# Patient Record
Sex: Female | Born: 1970 | Race: Black or African American | Hispanic: No | Marital: Single | Smoking: Current every day smoker
Health system: Southern US, Community
[De-identification: ages and names within clinical notes are randomized; demographics above are authoritative.]

## PROBLEM LIST (undated history)

## (undated) DIAGNOSIS — I10 Essential (primary) hypertension: Secondary | ICD-10-CM

## (undated) HISTORY — PX: LAPAROSCOPIC GASTRIC BAND REMOVAL WITH LAPAROSCOPIC GASTRIC SLEEVE RESECTION: SHX6498

## (undated) HISTORY — PX: TUBAL LIGATION: SHX77

## (undated) HISTORY — PX: LAPAROSCOPIC GASTRIC SLEEVE RESECTION: SHX5895

---

## 2009-10-26 ENCOUNTER — Emergency Department (HOSPITAL_COMMUNITY): Admission: EM | Admit: 2009-10-26 | Discharge: 2009-10-26 | Payer: Self-pay | Admitting: Emergency Medicine

## 2010-06-27 LAB — BASIC METABOLIC PANEL
BUN: 9 mg/dL (ref 6–23)
CO2: 24 mEq/L (ref 19–32)
Calcium: 8.9 mg/dL (ref 8.4–10.5)
Creatinine, Ser: 0.97 mg/dL (ref 0.4–1.2)
GFR calc Af Amer: >60 mL/min (ref >60)
Glucose, Bld: 99 mg/dL (ref 70–99)

## 2010-06-27 LAB — DIFFERENTIAL
Basophils Relative: 1 % (ref 0–1)
Eosinophils Absolute: 0.1 10*3/uL (ref 0.0–0.7)
Eosinophils Relative: 1 % (ref 0–5)
Neutrophils Relative %: 61 % (ref 43–77)

## 2010-06-27 LAB — CBC
MCH: 30.7 pg (ref 26.0–34.0)
MCHC: 33.8 g/dL (ref 30.0–36.0)
Platelets: 217 10*3/uL (ref 150–400)
RBC: 4.46 MIL/uL (ref 3.87–5.11)
RDW: 14 % (ref 11.5–15.5)

## 2010-08-13 ENCOUNTER — Emergency Department (HOSPITAL_COMMUNITY)
Admission: EM | Admit: 2010-08-13 | Discharge: 2010-08-14 | Disposition: A | Discharge status: Home / Self Care | Payer: Medicaid Other | Attending: Emergency Medicine | Admitting: Emergency Medicine

## 2010-08-13 DIAGNOSIS — R51 Headache: Secondary | ICD-10-CM | POA: Insufficient documentation

## 2010-08-13 DIAGNOSIS — K089 Disorder of teeth and supporting structures, unspecified: Secondary | ICD-10-CM | POA: Insufficient documentation

## 2010-08-13 DIAGNOSIS — K029 Dental caries, unspecified: Secondary | ICD-10-CM | POA: Insufficient documentation

## 2012-04-15 ENCOUNTER — Emergency Department (HOSPITAL_COMMUNITY)
Admission: EM | Admit: 2012-04-15 | Discharge: 2012-04-15 | Disposition: A | Discharge status: Home / Self Care | Payer: No Typology Code available for payment source | Attending: Emergency Medicine | Admitting: Emergency Medicine

## 2012-04-15 ENCOUNTER — Emergency Department (HOSPITAL_COMMUNITY): Payer: No Typology Code available for payment source

## 2012-04-15 ENCOUNTER — Encounter (HOSPITAL_COMMUNITY): Payer: Self-pay | Admitting: Emergency Medicine

## 2012-04-15 DIAGNOSIS — Y9389 Activity, other specified: Secondary | ICD-10-CM | POA: Insufficient documentation

## 2012-04-15 DIAGNOSIS — S161XXA Strain of muscle, fascia and tendon at neck level, initial encounter: Secondary | ICD-10-CM

## 2012-04-15 DIAGNOSIS — S335XXA Sprain of ligaments of lumbar spine, initial encounter: Secondary | ICD-10-CM | POA: Insufficient documentation

## 2012-04-15 DIAGNOSIS — Y92410 Unspecified street and highway as the place of occurrence of the external cause: Secondary | ICD-10-CM | POA: Insufficient documentation

## 2012-04-15 DIAGNOSIS — S139XXA Sprain of joints and ligaments of unspecified parts of neck, initial encounter: Secondary | ICD-10-CM | POA: Insufficient documentation

## 2012-04-15 DIAGNOSIS — S63509A Unspecified sprain of unspecified wrist, initial encounter: Secondary | ICD-10-CM | POA: Insufficient documentation

## 2012-04-15 DIAGNOSIS — S39012A Strain of muscle, fascia and tendon of lower back, initial encounter: Secondary | ICD-10-CM

## 2012-04-15 MED ORDER — CYCLOBENZAPRINE HCL 10 MG PO TABS: 10.0000 mg | ORAL_TABLET | Freq: Three times a day (TID) | ORAL | Status: DC | PRN

## 2012-04-15 MED ORDER — HYDROCODONE-ACETAMINOPHEN 5-325 MG PO TABS: 1.0000 | ORAL_TABLET | ORAL | Status: DC | PRN
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
## 2012-04-15 NOTE — ED Notes (Signed)
Pt returned from CT °

## 2012-04-15 NOTE — ED Notes (Signed)
Pt transported to CT ?

## 2012-08-26 ENCOUNTER — Encounter (HOSPITAL_COMMUNITY): Payer: Self-pay | Admitting: Physical Medicine and Rehabilitation

## 2012-08-26 ENCOUNTER — Emergency Department (HOSPITAL_COMMUNITY)
Admission: EM | Admit: 2012-08-26 | Discharge: 2012-08-26 | Disposition: A | Discharge status: Home / Self Care | Payer: Self-pay | Attending: Emergency Medicine | Admitting: Emergency Medicine

## 2012-08-26 DIAGNOSIS — W57XXXA Bitten or stung by nonvenomous insect and other nonvenomous arthropods, initial encounter: Secondary | ICD-10-CM

## 2012-08-26 DIAGNOSIS — Z880 Allergy status to penicillin: Secondary | ICD-10-CM | POA: Insufficient documentation

## 2012-08-26 DIAGNOSIS — T192XXA Foreign body in vulva and vagina, initial encounter: Secondary | ICD-10-CM | POA: Insufficient documentation

## 2012-08-26 DIAGNOSIS — R21 Rash and other nonspecific skin eruption: Secondary | ICD-10-CM | POA: Insufficient documentation

## 2012-08-26 DIAGNOSIS — Y939 Activity, unspecified: Secondary | ICD-10-CM | POA: Insufficient documentation

## 2012-08-26 DIAGNOSIS — Y929 Unspecified place or not applicable: Secondary | ICD-10-CM | POA: Insufficient documentation

## 2012-08-26 DIAGNOSIS — IMO0002 Reserved for concepts with insufficient information to code with codable children: Secondary | ICD-10-CM | POA: Insufficient documentation

## 2012-08-26 NOTE — ED Provider Notes (Signed)
History     CSN: [id number]  Arrival date & time 08/26/12  1019   First MD Initiated Contact with Patient 08/26/12 1025      No chief complaint on file.   (Consider location/radiation/quality/duration/timing/severity/associated sxs/prior treatment) HPI Comments: Patient presents to the ER for evaluation of possible retained tampon in vagina. Patient says that she thinks she left a tampon in 2 weeks ago. She has been trying to get it out but did not. She has been tried to pull it out as well as taking hot baths without success. She says there is a foul order associated with it. She has not had any further bleeding. There is no fever.  Patient also complains about a skin rash. Patient reports that her some brought straight to the room and she thinks that had fleas. She has multiple itchy bites on her  arms. She has been cleaning the areas with peroxide and putting hydrocortisone cream on, but it's not helping.   No past medical history on file.  No past surgical history on file.  No family history on file.  History  Substance Use Topics  . Smoking status: Never Smoker   . Smokeless tobacco: Never Used  . Alcohol Use:     OB History   Grav Para Term Preterm Abortions TAB SAB Ect Mult Living                  Review of Systems  Constitutional: Negative for fever.  Respiratory: Negative for shortness of breath.   Cardiovascular: Negative for chest pain.  Skin: Positive for rash.  All other systems reviewed and are negative.    Allergies  Penicillins  Home Medications   Current Outpatient Rx  Name  Route  Sig  Dispense  Refill  . cyclobenzaprine (FLEXERIL) 10 MG tablet   Oral   Take 1 tablet (10 mg total) by mouth 3 (three) times daily as needed for muscle spasms.   15 tablet   0   . HYDROcodone-acetaminophen (NORCO/VICODIN) 5-325 MG per tablet   Oral   Take 1 tablet by mouth every 4 (four) hours as needed for pain.   20 tablet   0   . ibuprofen  (ADVIL,MOTRIN) 200 MG tablet   Oral   Take 400 mg by mouth every 6 (six) hours as needed. For pain           There were no vitals taken for this visit.  Physical Exam  Constitutional: She is oriented to person, place, and time. She appears well-developed and well-nourished. No distress.  HENT:  Head: Normocephalic and atraumatic.  Right Ear: Hearing normal.  Left Ear: Hearing normal.  Nose: Nose normal.  Mouth/Throat: Oropharynx is clear and moist and mucous membranes are normal.  Eyes: Conjunctivae and EOM are normal. Pupils are equal, round, and reactive to light.  Neck: Normal range of motion. Neck supple.  Cardiovascular: Regular rhythm, S1 normal and S2 normal.  Exam reveals no gallop and no friction rub.   No murmur heard. Pulmonary/Chest: Effort normal and breath sounds normal. No respiratory distress. She exhibits no tenderness.  Abdominal: Soft. Normal appearance and bowel sounds are normal. There is no hepatosplenomegaly. There is no tenderness. There is no rebound, no guarding, no tenderness at McBurney's point and negative Murphy's sign. No hernia.  Musculoskeletal: Normal range of motion.  Neurological: She is alert and oriented to person, place, and time. She has normal strength. No cranial nerve deficit or sensory deficit. Coordination normal. GCS  eye subscore is 4. GCS verbal subscore is 5. GCS motor subscore is 6.  Skin: Skin is warm, dry and intact. No rash noted. No cyanosis.  Multiple excoriations on upper extremities without erythema, induration, fluctuance  Psychiatric: She has a normal mood and affect. Her speech is normal and behavior is normal. Thought content normal.    ED Course  Procedures (including critical care time)  Foreign body removal from vagina: Speculum was inserted into the vagina and tampon was visualized. It was grasped with ring forceps and removed in its entirety.  Labs Reviewed - No data to display No results found.   Diagnosis: 1.  Retained foreign body, vagina 2. Insect bites    MDM  Tampon was removed from the vagina. No further treatment necessary at this time. Patient does have multiple excoriated areas on her arms are consistent with the stated fleabites. No signs of infection. I recommended Benadryl and topical treatment.        Gilda Crease, MD 08/26/12 [phone]

## 2012-08-26 NOTE — ED Notes (Signed)
Pt discharged home. Had no further questions at the time. 

## 2012-08-26 NOTE — ED Notes (Signed)
Pt states tampon stuck in vagina x2 weeks. Unable to remove at home. Pt states 5/10 vaginal pressure at the time. No bleeding noted. Pt also states flea bites all over body from stray dog.

## 2012-08-26 NOTE — ED Notes (Signed)
Pt presents to department for evaluation of tampon in vagina x2 weeks. Pt states she is unable to remove. Pt states pain to vagina and itching. Also states flea bites from stray dog all over body. She is alert and oriented x4. No signs of acute distress noted.

## 2013-02-13 ENCOUNTER — Encounter (HOSPITAL_COMMUNITY): Payer: Self-pay | Admitting: Emergency Medicine

## 2013-02-13 ENCOUNTER — Emergency Department (HOSPITAL_COMMUNITY)
Admission: EM | Admit: 2013-02-13 | Discharge: 2013-02-13 | Disposition: A | Discharge status: Home / Self Care | Payer: No Typology Code available for payment source | Attending: Emergency Medicine | Admitting: Emergency Medicine

## 2013-02-13 DIAGNOSIS — IMO0002 Reserved for concepts with insufficient information to code with codable children: Secondary | ICD-10-CM | POA: Insufficient documentation

## 2013-02-13 DIAGNOSIS — Z880 Allergy status to penicillin: Secondary | ICD-10-CM | POA: Insufficient documentation

## 2013-02-13 DIAGNOSIS — Y92410 Unspecified street and highway as the place of occurrence of the external cause: Secondary | ICD-10-CM | POA: Insufficient documentation

## 2013-02-13 DIAGNOSIS — S39012A Strain of muscle, fascia and tendon of lower back, initial encounter: Secondary | ICD-10-CM

## 2013-02-13 DIAGNOSIS — S46911A Strain of unspecified muscle, fascia and tendon at shoulder and upper arm level, right arm, initial encounter: Secondary | ICD-10-CM

## 2013-02-13 DIAGNOSIS — Y9389 Activity, other specified: Secondary | ICD-10-CM | POA: Insufficient documentation

## 2013-02-13 MED ORDER — METHOCARBAMOL 500 MG PO TABS
500.0000 mg | ORAL_TABLET | Freq: Once | ORAL | Status: AC
  Administered 2013-02-13: 500 mg via ORAL
  Filled 2013-02-13: qty 1

## 2013-02-13 MED ORDER — METHOCARBAMOL 500 MG PO TABS: 500.0000 mg | ORAL_TABLET | Freq: Three times a day (TID) | ORAL | Status: DC

## 2013-02-13 MED ORDER — IBUPROFEN 600 MG PO TABS: 600.0000 mg | ORAL_TABLET | Freq: Three times a day (TID) | ORAL | Status: DC | PRN

## 2013-02-13 MED ORDER — IBUPROFEN 200 MG PO TABS
600.0000 mg | ORAL_TABLET | Freq: Once | ORAL | Status: AC
  Administered 2013-02-13: 600 mg via ORAL
  Filled 2013-02-13: qty 3
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
## 2013-02-13 NOTE — ED Provider Notes (Signed)
Medical screening examination/treatment/procedure(s) were performed by non-physician practitioner and as supervising physician I was immediately available for consultation/collaboration.    Luxe Cuadros M Kennon Encinas, MD 02/13/13 0339 
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
## 2013-10-08 ENCOUNTER — Emergency Department (HOSPITAL_COMMUNITY)
Admission: EM | Admit: 2013-10-08 | Discharge: 2013-10-08 | Disposition: A | Discharge status: Home / Self Care | Payer: Medicaid Other | Attending: Emergency Medicine | Admitting: Emergency Medicine

## 2013-10-08 ENCOUNTER — Emergency Department (HOSPITAL_COMMUNITY): Payer: Medicaid Other

## 2013-10-08 ENCOUNTER — Encounter (HOSPITAL_COMMUNITY): Payer: Self-pay | Admitting: Emergency Medicine

## 2013-10-08 DIAGNOSIS — B349 Viral infection, unspecified: Secondary | ICD-10-CM

## 2013-10-08 DIAGNOSIS — B9789 Other viral agents as the cause of diseases classified elsewhere: Secondary | ICD-10-CM | POA: Insufficient documentation

## 2013-10-08 DIAGNOSIS — Z79899 Other long term (current) drug therapy: Secondary | ICD-10-CM | POA: Insufficient documentation

## 2013-10-08 DIAGNOSIS — Z880 Allergy status to penicillin: Secondary | ICD-10-CM | POA: Insufficient documentation

## 2013-10-08 DIAGNOSIS — J029 Acute pharyngitis, unspecified: Secondary | ICD-10-CM

## 2013-10-08 LAB — CBC WITH DIFFERENTIAL/PLATELET
Basophils Absolute: 0 10*3/uL (ref 0.0–0.1)
Basophils Relative: 0 % (ref 0–1)
EOS ABS: 0.1 10*3/uL (ref 0.0–0.7)
Eosinophils Relative: 1 % (ref 0–5)
HEMATOCRIT: 39.7 % (ref 36.0–46.0)
HEMOGLOBIN: 13.2 g/dL (ref 12.0–15.0)
LYMPHS ABS: 1.7 10*3/uL (ref 0.7–4.0)
Lymphocytes Relative: 22 % (ref 12–46)
MCH: 29.5 pg (ref 26.0–34.0)
MCHC: 33.2 g/dL (ref 30.0–36.0)
MCV: 88.6 fL (ref 78.0–100.0)
MONO ABS: 0.5 10*3/uL (ref 0.1–1.0)
MONOS PCT: 6 % (ref 3–12)
NEUTROS PCT: 71 % (ref 43–77)
Neutro Abs: 5.7 10*3/uL (ref 1.7–7.7)
Platelets: 234 10*3/uL (ref 150–400)
RBC: 4.48 MIL/uL (ref 3.87–5.11)
RDW: 13.6 % (ref 11.5–15.5)
WBC: 8 10*3/uL (ref 4.0–10.5)

## 2013-10-08 LAB — MONONUCLEOSIS SCREEN: Mono Screen: NEGATIVE

## 2013-10-08 LAB — I-STAT CHEM 8, ED
BUN: 9 mg/dL (ref 6–23)
CALCIUM ION: 1.15 mmol/L (ref 1.12–1.23)
CREATININE: 0.9 mg/dL (ref 0.50–1.10)
Chloride: 107 mEq/L (ref 96–112)
GLUCOSE: 101 mg/dL — AB (ref 70–99)
HCT: 43 % (ref 36.0–46.0)
HEMOGLOBIN: 14.6 g/dL (ref 12.0–15.0)
POTASSIUM: 3.6 meq/L — AB (ref 3.7–5.3)
Sodium: 140 mEq/L (ref 137–147)
TCO2: 22 mmol/L (ref 0–100)

## 2013-10-08 LAB — RAPID STREP SCREEN (MED CTR MEBANE ONLY): Streptococcus, Group A Screen (Direct): NEGATIVE

## 2013-10-08 MED ORDER — DEXAMETHASONE SODIUM PHOSPHATE 10 MG/ML IJ SOLN
10.0000 mg | Freq: Once | INTRAMUSCULAR | Status: AC
  Administered 2013-10-08: 10 mg via INTRAMUSCULAR
  Filled 2013-10-08: qty 1

## 2013-10-08 MED ORDER — HYDROCOD POLST-CHLORPHEN POLST 10-8 MG/5ML PO LQCR: 5.0000 mL | Freq: Two times a day (BID) | ORAL | Status: DC | PRN

## 2013-10-08 MED ORDER — FLUTICASONE PROPIONATE 50 MCG/ACT NA SUSP: 2.0000 | Freq: Every day | NASAL | Status: DC

## 2013-10-08 NOTE — Discharge Instructions (Signed)
Sore Throat A sore throat is pain, burning, irritation, or scratchiness of the throat. There is often pain or tenderness when swallowing or talking. A sore throat may be accompanied by other symptoms, such as coughing, sneezing, fever, and swollen neck glands. A sore throat is often the first sign of another sickness, such as a cold, flu, strep throat, or mononucleosis (commonly known as mono). Most sore throats go away without medical treatment. CAUSES  The most common causes of a sore throat include:  A viral infection, such as a cold, flu, or mono.  A bacterial infection, such as strep throat, tonsillitis, or whooping cough.  Seasonal allergies.  Dryness in the air.  Irritants, such as smoke or pollution.  Gastroesophageal reflux disease (GERD). HOME CARE INSTRUCTIONS   Only take over-the-counter medicines as directed by your caregiver.  Drink enough fluids to keep your urine clear or pale yellow.  Rest as needed.  Try using throat sprays, lozenges, or sucking on hard candy to ease any pain (if older than 4 years or as directed).  Sip warm liquids, such as broth, herbal tea, or warm water with honey to relieve pain temporarily. You may also eat or drink cold or frozen liquids such as frozen ice pops.  Gargle with salt water (mix 1 tsp salt with 8 oz of water).  Do not smoke and avoid secondhand smoke.  Put a cool-mist humidifier in your bedroom at night to moisten the air. You can also turn on a hot shower and sit in the bathroom with the door closed for 5-10 minutes. SEEK IMMEDIATE MEDICAL CARE IF:  You have difficulty breathing.  You are unable to swallow fluids, soft foods, or your saliva.  You have increased swelling in the throat.  Your sore throat does not get better in 7 days.  You have nausea and vomiting.  You have a fever or persistent symptoms for more than 2-3 days.  You have a fever and your symptoms suddenly get worse. MAKE SURE YOU:   Understand  these instructions.  Will watch your condition.  Will get help right away if you are not doing well or get worse. Document Released: 05/06/2004 Document Revised: 03/15/2012 Document Reviewed: 12/05/2011 [facility] Patient Information 2015 [place], [place]. This information is not intended to replace advice given to you by your health care provider. Make sure you discuss any questions you have with your health care provider.  Upper Respiratory Infection, Adult An upper respiratory infection (URI) is also known as the common cold. It is often caused by a type of germ (virus). Colds are easily spread (contagious). You can pass it to others by kissing, coughing, sneezing, or drinking out of the same glass. Usually, you get better in 1 or 2 weeks.  HOME CARE   Only take medicine as told by your doctor.  Use a warm mist humidifier or breathe in steam from a hot shower.  Drink enough water and fluids to keep your pee (urine) clear or pale yellow.  Get plenty of rest.  Return to work when your temperature is back to normal or as told by your doctor. You may use a face mask and wash your hands to stop your cold from spreading. GET HELP RIGHT AWAY IF:   After the first few days, you feel you are getting worse.  You have questions about your medicine.  You have chills, shortness of breath, or brown or red spit (mucus).  You have yellow or brown snot (nasal discharge) or  pain in the face, especially when you bend forward.  You have a fever, puffy (swollen) neck, pain when you swallow, or white spots in the back of your throat.  You have a bad headache, ear pain, sinus pain, or chest pain.  You have a high-pitched whistling sound when you breathe in and out (wheezing).  You have a lasting cough or cough up blood.  You have sore muscles or a stiff neck. MAKE SURE YOU:   Understand these instructions.  Will watch your condition.  Will get help right away if you are not doing well or get  worse. Document Released: 09/15/2007 Document Revised: 06/21/2011 Document Reviewed: 08/03/2010 [facility] KinstonExitCare Patient Information 2015 [place], [place]. This information is not intended to replace advice given to you by your health care provider. Make sure you discuss any questions you have with your health care provider.

## 2013-10-08 NOTE — ED Provider Notes (Signed)
CSN: [id number]     Arrival date & time 10/08/13  1858 History   First MD Initiated Contact with Patient 10/08/13 2017     Chief Complaint  Patient presents with  . Shortness of Breath  . Sore Throat  . Cough     (Consider location/radiation/quality/duration/timing/severity/associated sxs/prior Treatment) HPI Comments: Patient is a 43 year old otherwise healthy female who presents today with shortness of breath, cough, congestion, sore throat. She states all her symptoms began today. It began with congestion and she used Vick's Vapo Rub and drank tea which did not improve her symptoms. Her cough is mostly dry, but sometimes she brings up mucus, but feels like it is "not enough". She denies hemoptysis. Her throat hurts more with swallowing. No trismus. 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 She travels to her client's home and believes they have given her bed bugs. The rash is worse at night. She has tried hydrocortisone cream with some improvement of her pain. No fevers, chills, vomiting, abdominal pain, chest pain.   The history is provided by the patient. No language interpreter was used.    History reviewed. No pertinent past medical history. History reviewed. No pertinent past surgical history. History reviewed. No pertinent family history. History  Substance Use Topics  . Smoking status: Never Smoker   . Smokeless tobacco: Never Used  . Alcohol Use: No   OB History   Grav Para Term Preterm Abortions TAB SAB Ect Mult Living                 Review of Systems  Constitutional: Negative for fever and chills.  HENT: Positive for congestion, sinus pressure and sore throat. Negative for ear pain.   Respiratory: Positive for cough and shortness of breath.   Cardiovascular: Negative for chest pain and leg swelling.  Gastrointestinal: Negative for nausea, vomiting and abdominal pain.  All other systems reviewed and are  negative.     Allergies  Penicillins  Home Medications   Prior to Admission medications   Medication Sig Start Date End Date Taking? Authorizing Charlottie Peragine  ibuprofen (ADVIL,MOTRIN) 600 MG tablet Take 1 tablet (600 mg total) by mouth every 8 (eight) hours as needed for pain. 02/13/13   Arman FilterGail K Schulz, NP  methocarbamol (ROBAXIN) 500 MG tablet Take 1 tablet (500 mg total) by mouth 3 (three) times daily. 02/13/13   Arman FilterGail K Schulz, NP   BP 145/87  Pulse 98  Temp(Src) 99.1 F (37.3 C) (Oral)  Resp 24  Ht 5\' 9"  (1.753 m)  Wt 300 lb (136.079 kg)  BMI 44.28 kg/m2  SpO2 98%  LMP 09/02/2013 Physical Exam  Nursing note and vitals reviewed. Constitutional: She is oriented to person, place, and time. She appears well-developed and well-nourished. No distress.  Patient appears comfortable and not ill appearing.   HENT:  Head: Normocephalic and atraumatic.  Right Ear: External ear and ear canal normal. Tympanic membrane is retracted.  Left Ear: External ear and ear canal normal. Tympanic membrane is retracted.  Nose: Mucosal edema present.  Mouth/Throat: Uvula is midline. No trismus in the jaw. No uvula swelling. Posterior oropharyngeal erythema present. No oropharyngeal exudate or tonsillar abscesses.  Bilateral enlargement of nasal turbinates. Bilateral tonsillar enlargement without exudate  Eyes: Conjunctivae are normal.  Neck: Normal range of motion.  No nuchal rigidity or meningeal signs  Cardiovascular: Normal rate, regular rhythm and normal heart sounds.   Pulmonary/Chest: Effort  normal and breath sounds normal. No stridor. No respiratory distress. She has no decreased breath sounds. She has no wheezes. She has no rales.  Speaking easily in full sentences.   Abdominal: Soft. She exhibits no distension. There is no tenderness.  Musculoskeletal: Normal range of motion.  Lymphadenopathy:    She has cervical adenopathy.  Neurological: She is alert and oriented to person, place, and  time. She has normal strength.  Skin: Skin is warm and dry. Rash noted. Rash is papular. She is not diaphoretic. No erythema.  Multiple papules diffusely over arms and abdomen.  Psychiatric: She has a normal mood and affect. Her behavior is normal.    ED Course  Procedures (including critical care time) Labs Review Labs Reviewed  I-STAT CHEM 8, ED - Abnormal; Notable for the following:    Potassium 3.6 (*)    Glucose, Bld 101 (*)    All other components within normal limits  RAPID STREP SCREEN  CULTURE, GROUP A STREP  CBC WITH DIFFERENTIAL  MONONUCLEOSIS SCREEN    Imaging Review Dg Chest 2 View  10/08/2013   CLINICAL DATA:  Cough.  Sore throat.  Shortness of breath.  EXAM: CHEST  2 VIEW  COMPARISON:  None.  FINDINGS: Cardiomediastinal silhouette unremarkable. Lungs clear. Bronchovascular markings normal. Pulmonary vascularity normal. No visible pleural effusions. No pneumothorax. Very slight thoracic scoliosis convex right.  IMPRESSION: No acute or significant abnormality.   Electronically Signed   By: Hulan Saashomas  Lawrence M.D.   On: 10/08/2013 20:35     EKG Interpretation None      MDM   Final diagnoses:  Viral syndrome  Viral pharyngitis   Pt CXR negative for acute infiltrate. Patients symptoms are consistent with URI, likely viral etiology. Discussed that antibiotics are not indicated for viral infections. Pt will be discharged with symptomatic treatment.  Verbalizes understanding and is agreeable with plan. I also discussed that her pruritic papules could be bed bug bites. Encouraged patient to use benadryl for itching and to wash all sheets and clothes in hot water and to spray couches and mattress with Nix spray. She has been using hydrocortisone cream with some relief of her symptoms. Pt is hemodynamically stable & in NAD prior to dc.    Mora BellmanHannah S Merrell, PA-C 10/10/13 [phone]

## 2013-10-08 NOTE — ED Notes (Signed)
Patient states she woke up with shortness of breath, cough and sore throat.  Patient states that it has been getting worse during the day.  Patient states she just doesn't feel well with the congestion.

## 2013-10-10 LAB — CULTURE, GROUP A STREP

## 2013-10-10 NOTE — ED Provider Notes (Signed)
Medical screening examination/treatment/procedure(s) were performed by non-physician practitioner and as supervising physician I was immediately available for consultation/collaboration.   EKG Interpretation None        Kristen N Ward, DO 10/10/13 1449 

## 2014-04-14 ENCOUNTER — Emergency Department (HOSPITAL_COMMUNITY): Payer: No Typology Code available for payment source

## 2014-04-14 ENCOUNTER — Encounter (HOSPITAL_COMMUNITY): Payer: Self-pay | Admitting: *Deleted

## 2014-04-14 ENCOUNTER — Emergency Department (HOSPITAL_COMMUNITY)
Admission: EM | Admit: 2014-04-14 | Discharge: 2014-04-14 | Disposition: A | Discharge status: Home / Self Care | Payer: No Typology Code available for payment source | Attending: Emergency Medicine | Admitting: Emergency Medicine

## 2014-04-14 DIAGNOSIS — R609 Edema, unspecified: Secondary | ICD-10-CM | POA: Insufficient documentation

## 2014-04-14 DIAGNOSIS — Y998 Other external cause status: Secondary | ICD-10-CM | POA: Insufficient documentation

## 2014-04-14 DIAGNOSIS — J069 Acute upper respiratory infection, unspecified: Secondary | ICD-10-CM | POA: Insufficient documentation

## 2014-04-14 DIAGNOSIS — Y9389 Activity, other specified: Secondary | ICD-10-CM | POA: Insufficient documentation

## 2014-04-14 DIAGNOSIS — H6593 Unspecified nonsuppurative otitis media, bilateral: Secondary | ICD-10-CM | POA: Insufficient documentation

## 2014-04-14 DIAGNOSIS — Y92410 Unspecified street and highway as the place of occurrence of the external cause: Secondary | ICD-10-CM | POA: Insufficient documentation

## 2014-04-14 DIAGNOSIS — S161XXA Strain of muscle, fascia and tendon at neck level, initial encounter: Secondary | ICD-10-CM | POA: Insufficient documentation

## 2014-04-14 DIAGNOSIS — Z7951 Long term (current) use of inhaled steroids: Secondary | ICD-10-CM | POA: Insufficient documentation

## 2014-04-14 DIAGNOSIS — Z880 Allergy status to penicillin: Secondary | ICD-10-CM | POA: Insufficient documentation

## 2014-04-14 DIAGNOSIS — S0990XA Unspecified injury of head, initial encounter: Secondary | ICD-10-CM | POA: Insufficient documentation

## 2014-04-14 MED ORDER — CYCLOBENZAPRINE HCL 10 MG PO TABS: 10.0000 mg | ORAL_TABLET | Freq: Two times a day (BID) | ORAL | Status: DC | PRN

## 2014-04-14 MED ORDER — HYDROCODONE-ACETAMINOPHEN 5-325 MG PO TABS: 1.0000 | ORAL_TABLET | Freq: Four times a day (QID) | ORAL | Status: DC | PRN

## 2014-04-14 MED ORDER — HYDROCODONE-ACETAMINOPHEN 5-325 MG PO TABS
1.0000 | ORAL_TABLET | Freq: Once | ORAL | Status: AC
  Administered 2014-04-14: 1 via ORAL
  Filled 2014-04-14: qty 1

## 2014-04-14 NOTE — Discharge Instructions (Signed)
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
## 2014-04-14 NOTE — ED Notes (Addendum)
Spoke with PA regarding whether or not to place c-collar. PA recommends C-collar.

## 2014-04-14 NOTE — ED Notes (Addendum)
Pt leaving for CT at this time. Pt medicated with Vicodin prior to leaving.
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
## 2014-04-14 NOTE — ED Notes (Addendum)
Large c-collar applied at this time. MD at bedside.

## 2014-04-14 NOTE — ED Notes (Signed)
Pt back from CT

## 2014-08-18 ENCOUNTER — Encounter (HOSPITAL_COMMUNITY): Payer: Self-pay | Admitting: Emergency Medicine

## 2014-08-18 ENCOUNTER — Emergency Department (HOSPITAL_COMMUNITY)
Admission: EM | Admit: 2014-08-18 | Discharge: 2014-08-18 | Disposition: A | Discharge status: Home / Self Care | Payer: Medicaid Other | Attending: Emergency Medicine | Admitting: Emergency Medicine

## 2014-08-18 DIAGNOSIS — Z7951 Long term (current) use of inhaled steroids: Secondary | ICD-10-CM | POA: Insufficient documentation

## 2014-08-18 DIAGNOSIS — R51 Headache: Secondary | ICD-10-CM

## 2014-08-18 DIAGNOSIS — I10 Essential (primary) hypertension: Secondary | ICD-10-CM | POA: Insufficient documentation

## 2014-08-18 DIAGNOSIS — S39012A Strain of muscle, fascia and tendon of lower back, initial encounter: Secondary | ICD-10-CM

## 2014-08-18 DIAGNOSIS — Y9389 Activity, other specified: Secondary | ICD-10-CM | POA: Insufficient documentation

## 2014-08-18 DIAGNOSIS — Y998 Other external cause status: Secondary | ICD-10-CM | POA: Insufficient documentation

## 2014-08-18 DIAGNOSIS — Z880 Allergy status to penicillin: Secondary | ICD-10-CM | POA: Insufficient documentation

## 2014-08-18 DIAGNOSIS — S0990XA Unspecified injury of head, initial encounter: Secondary | ICD-10-CM | POA: Insufficient documentation

## 2014-08-18 DIAGNOSIS — Y92410 Unspecified street and highway as the place of occurrence of the external cause: Secondary | ICD-10-CM | POA: Insufficient documentation

## 2014-08-18 DIAGNOSIS — R519 Headache, unspecified: Secondary | ICD-10-CM

## 2014-08-18 DIAGNOSIS — S199XXA Unspecified injury of neck, initial encounter: Secondary | ICD-10-CM | POA: Insufficient documentation

## 2014-08-18 HISTORY — DX: Essential (primary) hypertension: I10

## 2014-08-18 MED ORDER — HYDROCODONE-ACETAMINOPHEN 5-325 MG PO TABS: 1.0000 | ORAL_TABLET | Freq: Four times a day (QID) | ORAL | Status: DC | PRN
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
## 2014-10-10 ENCOUNTER — Emergency Department (HOSPITAL_COMMUNITY)
Admission: EM | Admit: 2014-10-10 | Discharge: 2014-10-10 | Disposition: A | Discharge status: Home / Self Care | Payer: No Typology Code available for payment source | Attending: Emergency Medicine | Admitting: Emergency Medicine

## 2014-10-10 ENCOUNTER — Emergency Department (HOSPITAL_COMMUNITY): Payer: No Typology Code available for payment source

## 2014-10-10 ENCOUNTER — Encounter (HOSPITAL_COMMUNITY): Payer: Self-pay

## 2014-10-10 DIAGNOSIS — Z880 Allergy status to penicillin: Secondary | ICD-10-CM | POA: Diagnosis not present

## 2014-10-10 DIAGNOSIS — I10 Essential (primary) hypertension: Secondary | ICD-10-CM | POA: Diagnosis not present

## 2014-10-10 DIAGNOSIS — Y92410 Unspecified street and highway as the place of occurrence of the external cause: Secondary | ICD-10-CM | POA: Insufficient documentation

## 2014-10-10 DIAGNOSIS — S6992XA Unspecified injury of left wrist, hand and finger(s), initial encounter: Secondary | ICD-10-CM | POA: Diagnosis not present

## 2014-10-10 DIAGNOSIS — S0990XA Unspecified injury of head, initial encounter: Secondary | ICD-10-CM | POA: Diagnosis present

## 2014-10-10 DIAGNOSIS — S0083XA Contusion of other part of head, initial encounter: Secondary | ICD-10-CM | POA: Insufficient documentation

## 2014-10-10 DIAGNOSIS — Z7951 Long term (current) use of inhaled steroids: Secondary | ICD-10-CM | POA: Insufficient documentation

## 2014-10-10 DIAGNOSIS — S199XXA Unspecified injury of neck, initial encounter: Secondary | ICD-10-CM | POA: Diagnosis not present

## 2014-10-10 DIAGNOSIS — Y9389 Activity, other specified: Secondary | ICD-10-CM | POA: Insufficient documentation

## 2014-10-10 DIAGNOSIS — S3992XA Unspecified injury of lower back, initial encounter: Secondary | ICD-10-CM | POA: Diagnosis not present

## 2014-10-10 DIAGNOSIS — S060X0A Concussion without loss of consciousness, initial encounter: Secondary | ICD-10-CM | POA: Insufficient documentation

## 2014-10-10 DIAGNOSIS — M79642 Pain in left hand: Secondary | ICD-10-CM

## 2014-10-10 DIAGNOSIS — Y998 Other external cause status: Secondary | ICD-10-CM | POA: Insufficient documentation

## 2014-10-10 DIAGNOSIS — S161XXA Strain of muscle, fascia and tendon at neck level, initial encounter: Secondary | ICD-10-CM

## 2014-10-10 MED ORDER — HYDROCODONE-ACETAMINOPHEN 5-325 MG PO TABS
2.0000 | ORAL_TABLET | Freq: Once | ORAL | Status: AC
  Administered 2014-10-10: 2 via ORAL
  Filled 2014-10-10: qty 2

## 2014-10-10 MED ORDER — HYDROCODONE-ACETAMINOPHEN 5-325 MG PO TABS: 1.0000 | ORAL_TABLET | Freq: Four times a day (QID) | ORAL | Status: DC | PRN
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
## 2014-10-10 NOTE — ED Notes (Signed)
Rob, PA at the bedside.  
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
## 2014-10-10 NOTE — Discharge Instructions (Signed)
Cervical Strain and Sprain (Whiplash) °with Rehab °Cervical strain and sprain are injuries that commonly occur with "whiplash" injuries. Whiplash occurs when the neck is forcefully whipped backward or forward, such as during a motor vehicle accident or during contact sports. The muscles, ligaments, tendons, discs, and nerves of the neck are susceptible to injury when this occurs. °RISK FACTORS °Risk of having a whiplash injury increases if: °· Osteoarthritis of the spine. °· Situations that make head or neck accidents or trauma more likely. °· High-risk sports (football, rugby, wrestling, hockey, auto racing, gymnastics, diving, contact karate, or boxing). °· Poor strength and flexibility of the neck. °· Previous neck injury. °· Poor tackling technique. °· Improperly fitted or padded equipment. °SYMPTOMS  °· Pain or stiffness in the front or back of neck or both. °· Symptoms may present immediately or up to 24 hours after injury. °· Dizziness, headache, nausea, and vomiting. °· Muscle spasm with soreness and stiffness in the neck. °· Tenderness and swelling at the injury site. °PREVENTION °· Learn and use proper technique (avoid tackling with the head, spearing, and head-butting; use proper falling techniques to avoid landing on the head). °· Warm up and stretch properly before activity. °· Maintain physical fitness: °· Strength, flexibility, and endurance. °· Cardiovascular fitness. °· Wear properly fitted and padded protective equipment, such as padded soft collars, for participation in contact sports. °PROGNOSIS  °Recovery from cervical strain and sprain injuries is dependent on the extent of the injury. These injuries are usually curable in 1 week to 3 months with appropriate treatment.  °RELATED COMPLICATIONS  °· Temporary numbness and weakness may occur if the nerve roots are damaged, and this may persist until the nerve has completely healed. °· Chronic pain due to frequent recurrence of  symptoms. °· Prolonged healing, especially if activity is resumed too soon (before complete recovery). °TREATMENT  °Treatment initially involves the use of ice and medication to help reduce pain and inflammation. It is also important to perform strengthening and stretching exercises and modify activities that worsen symptoms so the injury does not get worse. These exercises may be performed at home or with a therapist. For patients who experience severe symptoms, a soft, padded collar may be recommended to be worn around the neck.  °Improving your posture may help reduce symptoms. Posture improvement includes pulling your chin and abdomen in while sitting or standing. If you are sitting, sit in a firm chair with your buttocks against the back of the chair. While sleeping, try replacing your pillow with a small towel rolled to 2 inches in diameter, or use a cervical pillow or soft cervical collar. Poor sleeping positions delay healing.  °For patients with nerve root damage, which causes numbness or weakness, the use of a cervical traction apparatus may be recommended. Surgery is rarely necessary for these injuries. However, cervical strain and sprains that are present at birth (congenital) may require surgery. °MEDICATION  °· If pain medication is necessary, nonsteroidal anti-inflammatory medications, such as aspirin and ibuprofen, or other minor pain relievers, such as acetaminophen, are often recommended. °· Do not take pain medication for 7 days before surgery. °· Prescription pain relievers may be given if deemed necessary by your caregiver. Use only as directed and only as much as you need. °HEAT AND COLD:  °· Cold treatment (icing) relieves pain and reduces inflammation. Cold treatment should be applied for 10 to 15 minutes every 2 to 3 hours for inflammation and pain and immediately after any activity that aggravates   your symptoms. Use ice packs or an ice massage. °· Heat treatment may be used prior to  performing the stretching and strengthening activities prescribed by your caregiver, physical therapist, or athletic trainer. Use a heat pack or a warm soak. °SEEK MEDICAL CARE IF:  °· Symptoms get worse or do not improve in 2 weeks despite treatment. °· New, unexplained symptoms develop (drugs used in treatment may produce side effects). °EXERCISES °RANGE OF MOTION (ROM) AND STRETCHING EXERCISES - Cervical Strain and Sprain °These exercises may help you when beginning to rehabilitate your injury. In order to successfully resolve your symptoms, you must improve your posture. These exercises are designed to help reduce the forward-head and rounded-shoulder posture which contributes to this condition. Your symptoms may resolve with or without further involvement from your physician, physical therapist or athletic trainer. While completing these exercises, remember:  °· Restoring tissue flexibility helps normal motion to return to the joints. This allows healthier, less painful movement and activity. °· An effective stretch should be held for at least 20 seconds, although you may need to begin with shorter hold times for comfort. °· A stretch should never be painful. You should only feel a gentle lengthening or release in the stretched tissue. °STRETCH- Axial Extensors °· Lie on your back on the floor. You may bend your knees for comfort. Place a rolled-up hand towel or dish towel, about 2 inches in diameter, under the part of your head that makes contact with the floor. °· Gently tuck your chin, as if trying to make a "double chin," until you feel a gentle stretch at the base of your head. °· Hold __________ seconds. °Repeat __________ times. Complete this exercise __________ times per day.  °STRETCH - Axial Extension  °· Stand or sit on a firm surface. Assume a good posture: chest up, shoulders drawn back, abdominal muscles slightly tense, knees unlocked (if standing) and feet hip width apart. °· Slowly retract your  chin so your head slides back and your chin slightly lowers. Continue to look straight ahead. °· You should feel a gentle stretch in the back of your head. Be certain not to feel an aggressive stretch since this can cause headaches later. °· Hold for __________ seconds. °Repeat __________ times. Complete this exercise __________ times per day. °STRETCH - Cervical Side Bend  °· Stand or sit on a firm surface. Assume a good posture: chest up, shoulders drawn back, abdominal muscles slightly tense, knees unlocked (if standing) and feet hip width apart. °· Without letting your nose or shoulders move, slowly tip your right / left ear to your shoulder until your feel a gentle stretch in the muscles on the opposite side of your neck. °· Hold __________ seconds. °Repeat __________ times. Complete this exercise __________ times per day. °STRETCH - Cervical Rotators  °· Stand or sit on a firm surface. Assume a good posture: chest up, shoulders drawn back, abdominal muscles slightly tense, knees unlocked (if standing) and feet hip width apart. °· Keeping your eyes level with the ground, slowly turn your head until you feel a gentle stretch along the back and opposite side of your neck. °· Hold __________ seconds. °Repeat __________ times. Complete this exercise __________ times per day. °RANGE OF MOTION - Neck Circles  °· Stand or sit on a firm surface. Assume a good posture: chest up, shoulders drawn back, abdominal muscles slightly tense, knees unlocked (if standing) and feet hip width apart. °· Gently roll your head down and around from the   back of one shoulder to the back of the other. The motion should never be forced or painful. °· Repeat the motion 10-20 times, or until you feel the neck muscles relax and loosen. °Repeat __________ times. Complete the exercise __________ times per day. °STRENGTHENING EXERCISES - Cervical Strain and Sprain °These exercises may help you when beginning to rehabilitate your injury. They may  resolve your symptoms with or without further involvement from your physician, physical therapist, or athletic trainer. While completing these exercises, remember:  °· Muscles can gain both the endurance and the strength needed for everyday activities through controlled exercises. °· Complete these exercises as instructed by your physician, physical therapist, or athletic trainer. Progress the resistance and repetitions only as guided. °· You may experience muscle soreness or fatigue, but the pain or discomfort you are trying to eliminate should never worsen during these exercises. If this pain does worsen, stop and make certain you are following the directions exactly. If the pain is still present after adjustments, discontinue the exercise until you can discuss the trouble with your clinician. °STRENGTH - Cervical Flexors, Isometric °· Face a wall, standing about 6 inches away. Place a small pillow, a ball about 6-8 inches in diameter, or a folded towel between your forehead and the wall. °· Slightly tuck your chin and gently push your forehead into the soft object. Push only with mild to moderate intensity, building up tension gradually. Keep your jaw and forehead relaxed. °· Hold 10 to 20 seconds. Keep your breathing relaxed. °· Release the tension slowly. Relax your neck muscles completely before you start the next repetition. °Repeat __________ times. Complete this exercise __________ times per day. °STRENGTH- Cervical Lateral Flexors, Isometric  °· Stand about 6 inches away from a wall. Place a small pillow, a ball about 6-8 inches in diameter, or a folded towel between the side of your head and the wall. °· Slightly tuck your chin and gently tilt your head into the soft object. Push only with mild to moderate intensity, building up tension gradually. Keep your jaw and forehead relaxed. °· Hold 10 to 20 seconds. Keep your breathing relaxed. °· Release the tension slowly. Relax your neck muscles completely  before you start the next repetition. °Repeat __________ times. Complete this exercise __________ times per day. °STRENGTH - Cervical Extensors, Isometric  °· Stand about 6 inches away from a wall. Place a small pillow, a ball about 6-8 inches in diameter, or a folded towel between the back of your head and the wall. °· Slightly tuck your chin and gently tilt your head back into the soft object. Push only with mild to moderate intensity, building up tension gradually. Keep your jaw and forehead relaxed. °· Hold 10 to 20 seconds. Keep your breathing relaxed. °· Release the tension slowly. Relax your neck muscles completely before you start the next repetition. °Repeat __________ times. Complete this exercise __________ times per day. °POSTURE AND BODY MECHANICS CONSIDERATIONS - Cervical Strain and Sprain °Keeping correct posture when sitting, standing or completing your activities will reduce the stress put on different body tissues, allowing injured tissues a chance to heal and limiting painful experiences. The following are general guidelines for improved posture. Your physician or physical therapist will provide you with any instructions specific to your needs. While reading these guidelines, remember: °· The exercises prescribed by your provider will help you have the flexibility and strength to maintain correct postures. °· The correct posture provides the optimal environment for your joints to   work. All of your joints have less wear and tear when properly supported by a spine with good posture. This means you will experience a healthier, less painful body. °· Correct posture must be practiced with all of your activities, especially prolonged sitting and standing. Correct posture is as important when doing repetitive low-stress activities (typing) as it is when doing a single heavy-load activity (lifting). °PROLONGED STANDING WHILE SLIGHTLY LEANING FORWARD °When completing a task that requires you to lean  forward while standing in one place for a long time, place either foot up on a stationary 2- to 4-inch high object to help maintain the best posture. When both feet are on the ground, the low back tends to lose its slight inward curve. If this curve flattens (or becomes too large), then the back and your other joints will experience too much stress, fatigue more quickly, and can cause pain.  °RESTING POSITIONS °Consider which positions are most painful for you when choosing a resting position. If you have pain with flexion-based activities (sitting, bending, stooping, squatting), choose a position that allows you to rest in a less flexed posture. You would want to avoid curling into a fetal position on your side. If your pain worsens with extension-based activities (prolonged standing, working overhead), avoid resting in an extended position such as sleeping on your stomach. Most people will find more comfort when they rest with their spine in a more neutral position, neither too rounded nor too arched. Lying on a non-sagging bed on your side with a pillow between your knees, or on your back with a pillow under your knees will often provide some relief. Keep in mind, being in any one position for a prolonged period of time, no matter how correct your posture, can still lead to stiffness. °WALKING °Walk with an upright posture. Your ears, shoulders, and hips should all line up. °OFFICE WORK °When working at a desk, create an environment that supports good, upright posture. Without extra support, muscles fatigue and lead to excessive strain on joints and other tissues. °CHAIR: °· A chair should be able to slide under your desk when your back makes contact with the back of the chair. This allows you to work closely. °· The chair's height should allow your eyes to be level with the upper part of your monitor and your hands to be slightly lower than your elbows. °· Body position: °¨ Your feet should make contact with the  floor. If this is not possible, use a foot rest. °¨ Keep your ears over your shoulders. This will reduce stress on your neck and low back. °Document Released: 03/29/2005 Document Revised: 08/13/2013 Document Reviewed: 07/11/2008 °[employer]® Patient Information ©2015 [employer]. This information is not intended to replace advice given to you by your health care provider. Make sure you discuss any questions you have with your health care provider. °Motor Vehicle Collision °It is common to have multiple bruises and sore muscles after a motor vehicle collision (MVC). These tend to feel worse for the first 24 hours. You may have the most stiffness and soreness over the first several hours. You may also feel worse when you wake up the first morning after your collision. After this point, you will usually begin to improve with each day. The speed of improvement often depends on the severity of the collision, the number of injuries, and the location and nature of these injuries. °HOME CARE INSTRUCTIONS °· Put ice on the injured area. °¨ Put ice in a   plastic bag. °¨ Place a towel between your skin and the bag. °¨ Leave the ice on for 15-20 minutes, 3-4 times a day, or as directed by your health care provider. °· Drink enough fluids to keep your urine clear or pale yellow. Do not drink alcohol. °· Take a warm shower or bath once or twice a day. This will increase blood flow to sore muscles. °· You may return to activities as directed by your caregiver. Be careful when lifting, as this may aggravate neck or back pain. °· Only take over-the-counter or prescription medicines for pain, discomfort, or fever as directed by your caregiver. Do not use aspirin. This may increase bruising and bleeding. °SEEK IMMEDIATE MEDICAL CARE IF: °· You have numbness, tingling, or weakness in the arms or legs. °· You develop severe headaches not relieved with medicine. °· You have severe neck pain, especially tenderness in the middle of the back  of your neck. °· You have changes in bowel or bladder control. °· There is increasing pain in any area of the body. °· You have shortness of breath, light-headedness, dizziness, or fainting. °· You have chest pain. °· You feel sick to your stomach (nauseous), throw up (vomit), or sweat. °· You have increasing abdominal discomfort. °· There is blood in your urine, stool, or vomit. °· You have pain in your shoulder (shoulder strap areas). °· You feel your symptoms are getting worse. °MAKE SURE YOU: °· Understand these instructions. °· Will watch your condition. °· Will get help right away if you are not doing well or get worse. °Document Released: 03/29/2005 Document Revised: 08/13/2013 Document Reviewed: 08/26/2010 °[employer]® Patient Information ©2015 [employer]. This information is not intended to replace advice given to you by your health care provider. Make sure you discuss any questions you have with your health care provider. ° °

## 2014-10-10 NOTE — ED Notes (Signed)
Pt returned from CT. Hooked up to BP and pulse ox.

## 2014-11-16 ENCOUNTER — Emergency Department (HOSPITAL_COMMUNITY)
Admission: EM | Admit: 2014-11-16 | Discharge: 2014-11-16 | Disposition: A | Discharge status: Home / Self Care | Payer: Medicaid - Out of State | Attending: Emergency Medicine | Admitting: Emergency Medicine

## 2014-11-16 ENCOUNTER — Encounter (HOSPITAL_COMMUNITY): Payer: Self-pay | Admitting: Emergency Medicine

## 2014-11-16 ENCOUNTER — Emergency Department (HOSPITAL_COMMUNITY): Payer: Medicaid - Out of State

## 2014-11-16 DIAGNOSIS — M533 Sacrococcygeal disorders, not elsewhere classified: Secondary | ICD-10-CM

## 2014-11-16 DIAGNOSIS — S79911A Unspecified injury of right hip, initial encounter: Secondary | ICD-10-CM | POA: Diagnosis not present

## 2014-11-16 DIAGNOSIS — Z880 Allergy status to penicillin: Secondary | ICD-10-CM | POA: Diagnosis not present

## 2014-11-16 DIAGNOSIS — M25551 Pain in right hip: Secondary | ICD-10-CM

## 2014-11-16 DIAGNOSIS — Y998 Other external cause status: Secondary | ICD-10-CM | POA: Insufficient documentation

## 2014-11-16 DIAGNOSIS — S3992XA Unspecified injury of lower back, initial encounter: Secondary | ICD-10-CM | POA: Diagnosis not present

## 2014-11-16 DIAGNOSIS — S24109A Unspecified injury at unspecified level of thoracic spinal cord, initial encounter: Secondary | ICD-10-CM | POA: Diagnosis present

## 2014-11-16 DIAGNOSIS — I10 Essential (primary) hypertension: Secondary | ICD-10-CM | POA: Insufficient documentation

## 2014-11-16 DIAGNOSIS — W010XXA Fall on same level from slipping, tripping and stumbling without subsequent striking against object, initial encounter: Secondary | ICD-10-CM | POA: Insufficient documentation

## 2014-11-16 DIAGNOSIS — Y9301 Activity, walking, marching and hiking: Secondary | ICD-10-CM | POA: Diagnosis not present

## 2014-11-16 DIAGNOSIS — Y92512 Supermarket, store or market as the place of occurrence of the external cause: Secondary | ICD-10-CM | POA: Insufficient documentation

## 2014-11-16 DIAGNOSIS — M546 Pain in thoracic spine: Secondary | ICD-10-CM

## 2014-11-16 MED ORDER — OXYCODONE-ACETAMINOPHEN 5-325 MG PO TABS
1.0000 | ORAL_TABLET | Freq: Once | ORAL | Status: AC
  Administered 2014-11-16: 1 via ORAL
  Filled 2014-11-16: qty 1

## 2014-11-16 MED ORDER — IBUPROFEN 800 MG PO TABS: 800.0000 mg | ORAL_TABLET | Freq: Three times a day (TID) | ORAL | Status: DC

## 2014-11-16 NOTE — Discharge Instructions (Signed)
1. Medications: ibuprofen, usual home medications 2. Treatment: rest, drink plenty of fluids, heat, back exercises 3. Follow Up: please followup with your primary doctor in 2 days for discussion of your diagnoses and further evaluation after today's visit; if you do not have a primary care doctor use the resource guide provided to find one; please return to the ER for new or worsening symptoms   Back Exercises Back exercises help treat and prevent back injuries. The goal of back exercises is to increase the strength of your abdominal and back muscles and the flexibility of your back. These exercises should be started when you no longer have back pain. Back exercises include:  Pelvic Tilt. Lie on your back with your knees bent. Tilt your pelvis until the lower part of your back is against the floor. Hold this position 5 to 10 sec and repeat 5 to 10 times.  Knee to Chest. Pull first 1 knee up against your chest and hold for 20 to 30 seconds, repeat this with the other knee, and then both knees. This may be done with the other leg straight or bent, whichever feels better.  Sit-Ups or Curl-Ups. Bend your knees 90 degrees. Start with tilting your pelvis, and do a partial, slow sit-up, lifting your trunk only 30 to 45 degrees off the floor. Take at least 2 to 3 seconds for each sit-up. Do not do sit-ups with your knees out straight. If partial sit-ups are difficult, simply do the above but with only tightening your abdominal muscles and holding it as directed.  Hip-Lift. Lie on your back with your knees flexed 90 degrees. Push down with your feet and shoulders as you raise your hips a couple inches off the floor; hold for 10 seconds, repeat 5 to 10 times.  Back arches. Lie on your stomach, propping yourself up on bent elbows. Slowly press on your hands, causing an arch in your low back. Repeat 3 to 5 times. Any initial stiffness and discomfort should lessen with repetition over time.  Shoulder-Lifts. Lie  face down with arms beside your body. Keep hips and torso pressed to floor as you slowly lift your head and shoulders off the floor. Do not overdo your exercises, especially in the beginning. Exercises may cause you some mild back discomfort which lasts for a few minutes; however, if the pain is more severe, or lasts for more than 15 minutes, do not continue exercises until you see your caregiver. Improvement with exercise therapy for back problems is slow.  See your caregivers for assistance with developing a proper back exercise program. Document Released: 05/06/2004 Document Revised: 06/21/2011 Document Reviewed: 01/28/2011 [facility] Patient Information 2015 [place], [place]. This information is not intended to replace advice given to you by your health care provider. Make sure you discuss any questions you have with your health care provider.  Back Pain, Adult Low back pain is very common. About 1 in 5 people have back pain.The cause of low back pain is rarely dangerous. The pain often gets better over time.About half of people with a sudden onset of back pain feel better in just 2 weeks. About 8 in 10 people feel better by 6 weeks.  CAUSES Some common causes of back pain include:  Strain of the muscles or ligaments supporting the spine.  Wear and tear (degeneration) of the spinal discs.  Arthritis.  Direct injury to the back. DIAGNOSIS Most of the time, the direct cause of low back pain is not known.However, back pain  can be treated effectively even when the exact cause of the pain is unknown.Answering your caregiver's questions about your overall health and symptoms is one of the most accurate ways to make sure the cause of your pain is not dangerous. If your caregiver needs more information, he or she may order lab work or imaging tests (X-rays or MRIs).However, even if imaging tests show changes in your back, this usually does not require surgery. HOME CARE INSTRUCTIONS For many  people, back pain returns.Since low back pain is rarely dangerous, it is often a condition that people can learn to [facility] their own.   Remain active. It is stressful on the back to sit or stand in one place. Do not sit, drive, or stand in one place for more than 30 minutes at a time. Take short walks on level surfaces as soon as pain allows.Try to increase the length of time you walk each day.  Do not stay in bed.Resting more than 1 or 2 days can delay your recovery.  Do not avoid exercise or work.Your body is made to move.It is not dangerous to be active, even though your back may hurt.Your back will likely heal faster if you return to being active before your pain is gone.  Pay attention to your body when you bend and lift. Many people have less discomfortwhen lifting if they bend their knees, keep the load close to their bodies,and avoid twisting. Often, the most comfortable positions are those that put less stress on your recovering back.  Find a comfortable position to sleep. Use a firm mattress and lie on your side with your knees slightly bent. If you lie on your back, put a pillow under your knees.  Only take over-the-counter or prescription medicines as directed by your caregiver. Over-the-counter medicines to reduce pain and inflammation are often the most helpful.Your caregiver may prescribe muscle relaxant drugs.These medicines help dull your pain so you can more quickly return to your normal activities and healthy exercise.  Put ice on the injured area.  Put ice in a plastic bag.  Place a towel between your skin and the bag.  Leave the ice on for 15-20 minutes, 03-04 times a day for the first 2 to 3 days. After that, ice and heat may be alternated to reduce pain and spasms.  Ask your caregiver about trying back exercises and gentle massage. This may be of some benefit.  Avoid feeling anxious or stressed.Stress increases muscle tension and can worsen back pain.It  is important to recognize when you are anxious or stressed and learn ways to manage it.Exercise is a great option. SEEK MEDICAL CARE IF:  You have pain that is not relieved with rest or medicine.  You have pain that does not improve in 1 week.  You have new symptoms.  You are generally not feeling well. SEEK IMMEDIATE MEDICAL CARE IF:   You have pain that radiates from your back into your legs.  You develop new bowel or bladder control problems.  You have unusual weakness or numbness in your arms or legs.  You develop nausea or vomiting.  You develop abdominal pain.  You feel faint. Document Released: 03/29/2005 Document Revised: 09/28/2011 Document Reviewed: 07/31/2013 [facility] Patient Information 2015 [place], [place]. This information is not intended to replace advice given to you by your health care provider. Make sure you discuss any questions you have with your health care provider.   Emergency Department Resource Guide 1) Find a Doctor and Pay Out  of Pocket Although you won't have to find out who is covered by your insurance plan, it is a good idea to ask around and get recommendations. You will then need to call the office and see if the doctor you have chosen will accept you as a new patient and what types of options they offer for patients who are self-pay. Some doctors offer discounts or will set up payment plans for their patients who do not have insurance, but you will need to ask so you aren't surprised when you get to your appointment.  2) Contact Your Local Health Department Not all health departments have doctors that can see patients for sick visits, but many do, so it is worth a call to see if yours does. If you don't know where your local health department is, you can check in your phone book. The CDC also has a tool to help you locate your state's health department, and many state websites also have listings of all of their local health departments.  3) Find a  Walk-in Clinic If your illness is not likely to be very severe or complicated, you may want to try a walk in clinic. These are popping up all over the country in pharmacies, drugstores, and shopping centers. They're usually staffed by nurse practitioners or physician assistants that have been trained to treat common illnesses and complaints. They're usually fairly quick and inexpensive. However, if you have serious medical issues or chronic medical problems, these are probably not your best option.  No Primary Care Doctor: - Call Health Connect at  [phone] - they can help you locate a primary care doctor that  accepts your insurance, provides certain services, etc. - Physician Referral Service- [phone]  Chronic Pain Problems: Organization         Address  Phone   Notes  [facility]  [phone] Patients need to be referred by their primary care doctor.   Medication Assistance: Organization         Address  Phone   Notes  [facility] Medication [facility] [address] [place] [place], [place] [address] [phone] --Must be a resident of [facility] -- Must have NO insurance coverage whatsoever (no Medicaid/ Medicare, etc.) -- The pt. MUST have a primary care doctor that directs their care regularly and follows them in the community   [facility]  [phone]   [employer]  [phone]    Agencies that provide inexpensive medical care: Organization         Address  Phone   Notes  [facility]  [phone]   [facility]    [phone]   [facility] [address] [place], [place] [address] [phone]   [facility] [address] [place], [place] [phone]   Planned Parenthood    [phone]   [facility]    [phone]   [facility]  [address], [place] Phone:  [phone], Fax:  [phone] Hours of Operation:  9 am - 6 pm, M-F.  Also accepts Medicaid/Medicare and self-pay.  [facility]  [address], [place] Phone: [phone], Fax: [phone]. Hours of Operation:  8:30 am - 5:30 pm, M-F.  Also accepts Medicaid and self-pay.  [facility] [place] [address], [employer] Phone: [phone]   [facility] [address] Natasha Bence [place], [place] [phone], Ext. [phone] Mondays & Thursdays: 7-9 AM.  First 15 patients are seen on a first come, first serve basis.    Medicaid-accepting [facility] Providers:  Organization         Address  Phone   Notes  [facility] [address], [place] [phone] Also accepts self-pay patients.  [facility] [address] Laurell Josephs [place], [place]  [phone]   [facility] [address], [place] [phone]   [facility] [address], [place] [phone]   Renaye Rakers [address], [place]   [phone] Only accepts [place] Access [place] patients after they have their name applied to their card.   Self-Pay (no insurance) in [facility]:  Organization         Address  Phone   Notes  Sickle Cell Patients, [facility] Internal Medicine [address] [place], [place] [phone]   [facility] [address] [place], [place] [phone]   [facility] [place]  [address], [place] [phone]   [facility]/Dr. Osei-Bonsu  [address], [place] or [address], [place] [phone] Phone number for both [place] and [place] locations is the same.  [facility] [address], [place] [phone]   [facility] [address], [place] or [address] [phone]     [facility] [address], [place] [phone], phone; [phone], fax Sees patients 1st and 3rd Saturday of every month.  Must not qualify for public or private insurance (i.e. Medicaid, Medicare, [place] Health Choice, Veterans' Benefits)  Household income should be no more than 200% of the poverty level The clinic cannot treat you if you are pregnant or think you are pregnant  Sexually transmitted diseases are not treated at the clinic.    Dental Care: Organization         Address  Phone  Notes  [facility] Department of [facility] [address] [place], [place] [phone] Accepts children up to age 8 who are enrolled in [place] or [place] Health Choice; pregnant women with a Medicaid card; and children who have applied for Medicaid or [place] Health Choice, but were declined, whose parents can pay a reduced fee at time of service.  [facility] Department of [facility]  [address], [place] [phone] Accepts children up to age 24 who are enrolled in [place] or [place] Health Choice; pregnant women with a Medicaid card; and children who have applied for Medicaid or  Health Choice, but were declined, whose parents can pay a reduced fee at time of service.  [facility]  [address] [place], [place] [phone] Patients are seen by appointment only. Walk-ins are not accepted. [facility] will see patients 80 years of age and older. Monday - Tuesday (8am-5pm) Most Wednesdays (8:30-5pm) $30 per visit, cash only  [facility] Adult Dental Access PRO[facility]  [address], [facility] [phone] Patients are seen by appointment only. Walk-ins are not accepted. [facility] will see patients 53 years of age and older. One Wednesday Evening (Monthly: Volunteer Based).  $30 per visit, cash  only  [employer] of Dentistry Clinics  [phone] for adults; Children under age 18, call  Graduate Pediatric Dentistry at [phone]. Children aged 71-14, please call [phone] to request a pediatric application.  Dental services are provided in all areas of dental care including fillings, crowns and bridges, complete and partial dentures, implants, gum treatment, root canals, and extractions. Preventive care is also provided. Treatment is provided to both adults and children. Patients are selected via a lottery and there is often a waiting list.   [facility] [address] [place]  [phone] [email or web address]   [facility] [address] [place], [place] [phone], Ext. [phone] Second and Fourth Thursday of each month, opens at 6:30 AM; Clinic ends at 9 AM.  Patients are seen on a first-come first-served basis, and a limited number are seen during each clinic.   [facility]  [address] Ether Griffins [place], [place] [phone]   Eligibility Requirements You must have lived in [place], [place], or [place] counties for at least the last three months.   You cannot be eligible for state or federal sponsored [place], including [employer], [place], or [employer].   You generally cannot be eligible for healthcare insurance through your employer.    How to apply: Eligibility screenings are held every Tuesday and Wednesday afternoon from 1:00 pm until 4:00 pm. You do not need an appointment for the interview!  [facility] [address], [place], [place] [phone]   [facility] Health Department  [phone]   [facility] Health Department  [phone]   [facility] Health Department  [phone]    Behavioral Health Resources in the Community: Intensive Outpatient Programs Organization         Address  Phone  Notes  [facility] Services [address] [place], [place] [phone]   [facility] [address], [place], [place]  [phone]   [facility] [address] [place], [place]  [phone]   [facility] Mental Health [address],  [place], [place] [phone] or [phone]   Substance Abuse Resources Organization         Address  Phone  Notes  [facility] Drug [facility]  [phone]   [facility]  [phone]   [facility] [place]  [phone]   Floydene Flock  [phone]   Residential & Outpatient Substance Abuse Program  [phone]   Psychological Services Organization         Address  Phone  Notes  [facility] Behavioral Health  [phone]   [facility] Services  [phone]   [facility] Mental Health [address] [place] [phone] or [phone]    Mobile Crisis Teams Organization         Address  Phone  Notes  [facility], Mobile Crisis Care Unit  [phone]   [facility]  [address] [place], [place] [phone]   Doristine Locks [address] [place] [phone]    Self-Help/Support Groups Organization         Address  Phone             Notes  [facility] [place] - variety of support groups  336- I7437963 Call for more information  Narcotics Anonymous (NA), [facility] [address], [employer] [place]  2 meetings at this location   Statistician  Address  Phone  Notes  [facility] [address]    [place]  [phone]   [facility]  [address] [place] [address], [place], [place] [phone]   [facility] [address] [place], [place] [phone] Admissions: 8am-3pm M-F  [facility] [address],    [place], [place] [phone]   [facility] [address] [place], [place], [place] [phone]   [facility] [address],  [place], [place] [phone]   [facility] - Intensive Outpatient [address] Laurell Josephs [address], [place], [place] [phone]   [facility] ([facility] [address] [place]  [place], [place] [phone] or [phone]   [facility] ([facility]) [address], [place], [place] [phone] Accepts Medicaid  [facility] [place] [address]  [place] [phone] Substance Abuse/Addiction Treatment   [facility] Organization         Address  Phone  Notes  [facility]  [phone]   Angie Fava, PhD [address] Ervin Knack [place], [place]   [phone] or [phone]   [facility] Behavioral   [address] [place], [place] ([phone]   [facility] [address], [place], [place] [phone] Insurance/Medicaid/sponsorship through [facility] and Families [address]                                    [place], [place] [phone] Therapy/tele-psych/case  [facility] [address], [place] [phone]    Dr. Lolly Mustache  [phone]   [facility] [place]  [employer] [facility] [facility] Dept. 1) [address] [place] 2) [address] [place] 3)  [address], [place] [phone]  [phone]   [facility] Child Abuse Hotline 919-524-7327 or [phone] (After Hours)

## 2014-11-16 NOTE — ED Notes (Signed)
Pt was at [employer] and fell. She states she "did a split" and injured her R hip, R elbow, and tailbone. EMS was on scene to examine pt. Pt states she is able to ambulate, but it is painful. Pt arrives in wheelchair.

## 2014-11-16 NOTE — ED Notes (Signed)
Pt has a ride home.  

## 2014-11-16 NOTE — ED Provider Notes (Signed)
CSN: [id number]     Arrival date & time 11/16/14  1900 History  This chart was scribed for non-physician practitioner Glean Hess, PA-C, working with No att. providers found, by Andrew Au, ED Scribe. This patient was seen in room WTR9/WTR9 and the patient's care was started at 7:36 PM.   Chief Complaint  Patient presents with  . Fall   The history is provided by the patient. No language interpreter was used.    HPI Comments:  Andrea Lee is a 44 y.o. female who presents to the Emergency Department complaining of a fall that occurred around 15:00 this afternoon. Patient states she was walking in a grocery store when she slipped and fell on a wet floor, landing on her right side. She reports needing assistance standing after she fell, but has been able to ambulate since, though this causes her pain. She now presents right sided low back, hip, and buttock pain, exacerbated with sitting and movement. Patient has not taken medication to treat pain. She denies head injury and LOC. She denies numbness and tingling in upper and lower extremities. She denies dizziness, light headedness, weakness, HA, abdominal pain, nausea, emesis, bladder and bowel incontinence, saddle anesthesia.   Past Medical History  Diagnosis Date  . Hypertension    Past Surgical History  Procedure Laterality Date  . Tubal ligation     No family history on file. History  Substance Use Topics  . Smoking status: Never Smoker   . Smokeless tobacco: Never Used  . Alcohol Use: No   OB History    No data available     Review of Systems  Respiratory: Negative for shortness of breath.   Cardiovascular: Negative for chest pain.  Gastrointestinal: Negative for nausea, vomiting and abdominal pain.  Genitourinary: Negative for enuresis.  Musculoskeletal: Positive for myalgias and arthralgias. Negative for gait problem, neck pain and neck stiffness.       Reports right sided low back, hip pain, buttock pain.  Skin:  Positive for wound. Negative for color change, pallor and rash.       Superficial abrasion to right elbow.  Neurological: Negative for dizziness, syncope, weakness, light-headedness, numbness and headaches.   Allergies  Penicillins  Home Medications   Prior to Admission medications   Medication Sig Start Date End Date Taking? Authorizing Provider  Chlorphen-Pseudoephed-APAP (THERAFLU FLU/COLD PO) Take 1 packet by mouth 2 (two) times daily as needed (for cold symptoms).     Historical Provider, MD  chlorpheniramine-HYDROcodone (TUSSIONEX PENNKINETIC ER) 10-8 MG/5ML LQCR Take 5 mLs by mouth every 12 (twelve) hours as needed for cough (Cough). Patient not taking: Reported on 04/14/2014 10/08/13   Junious Silk, PA-C  cyclobenzaprine (FLEXERIL) 10 MG tablet Take 1 tablet (10 mg total) by mouth 2 (two) times daily as needed for muscle spasms. 04/14/14   Gwyneth Sprout, MD  fluticasone (FLONASE) 50 MCG/ACT nasal spray Place 2 sprays into both nostrils daily. Patient not taking: Reported on 04/14/2014 10/08/13   Junious Silk, PA-C  HYDROcodone-acetaminophen (NORCO/VICODIN) 5-325 MG per tablet Take 1-2 tablets by mouth every 6 (six) hours as needed. 10/10/14   Roxy Horseman, PA-C    BP 131/80 mmHg  Pulse 89  Temp(Src) 98.2 F (36.8 C) (Oral)  Resp 16  SpO2 99% Physical Exam  Constitutional: She is oriented to person, place, and time. Vital signs are normal. She appears well-developed and well-nourished. No distress.  HENT:  Head: Normocephalic and atraumatic.  Right Ear: External ear normal.  Left Ear: External  ear normal.  Nose: Nose normal.  Mouth/Throat: Uvula is midline, oropharynx is clear and moist and mucous membranes are normal.  Eyes: Conjunctivae and EOM are normal. Pupils are equal, round, and reactive to light.  Neck: Normal range of motion. Neck supple.  Cardiovascular: Normal rate, regular rhythm, normal heart sounds and intact distal pulses.   Pulmonary/Chest: Effort normal  and breath sounds normal. No respiratory distress. She has no wheezes. She has no rales. She exhibits no tenderness.  Abdominal: Soft. She exhibits no distension and no mass. There is no tenderness. There is no rebound and no guarding.  Musculoskeletal: Normal range of motion. She exhibits tenderness. She exhibits no edema.  Tenderness to palpation of right lumbar paraspinal muscles. No midline tenderness, step-off, or deformity. Tenderness to palpation of right hip, right buttock, coccyx. Full range of motion of upper and lower extremities bilaterally.  Neurological: She is alert and oriented to person, place, and time. She has normal strength and normal reflexes. No sensory deficit.  5/5 strength upper and lower extremities bilaterally.  Skin: Skin is warm and dry. No ecchymosis and no rash noted. No erythema. No pallor.  Psychiatric: She has a normal mood and affect. Her behavior is normal.  Nursing note and vitals reviewed.   ED Course  Procedures (including critical care time)  DIAGNOSTIC STUDIES: Oxygen Saturation is 99% on RA, normal by my interpretation.    COORDINATION OF CARE: 7:37 PM- Pt advised of plan for treatment and pt agrees.  Labs Review Labs Reviewed - No data to display  Imaging Review No results found.   EKG Interpretation None      MDM   Final diagnoses:  Right-sided thoracic back pain  Right hip pain  Coccyx pain    44 year old female with right lumbar paraspinal pain, right hip pain, right buttock pain, and coccyx pain s/p mechanical fall in grocery store after slipping on spilled soda. Patient did not hit her head and had no LOC. No neurological deficits and normal neuro exam. Sensation and strength intact. Full range of motion of bilateral upper and lower extremities. Tenderness to palpation of right paraspinal muscles of lumbar spine, no midline tenderness, step-off, or deformity. Tenderness to palpation of right hip, right buttocks, and coccyx.  Patient can walk but states is painful. No loss of bowel or bladder control or saddle anesthesia. No concern for cauda equina. Pain controlled with percocet in the ED. Imaging of right hip and pelvis pending.   8:05 PM X-rays pending at this time. Patient signed off to PA Laveda Norman at shift change. If x-rays are negative, plan to discharge home with ibuprofen, back exercises, and PCP follow-up.  I personally performed the services described in this documentation, which was scribed in my presence. The recorded information has been reviewed and is accurate.    Mady Gemma, PA-C 11/16/14 2028  Richardean Canal, MD 11/16/14 [phone]

## 2014-12-10 ENCOUNTER — Emergency Department (HOSPITAL_COMMUNITY)
Admission: EM | Admit: 2014-12-10 | Discharge: 2014-12-10 | Disposition: A | Discharge status: Home / Self Care | Payer: No Typology Code available for payment source | Attending: Emergency Medicine | Admitting: Emergency Medicine

## 2014-12-10 ENCOUNTER — Emergency Department (HOSPITAL_COMMUNITY): Payer: No Typology Code available for payment source

## 2014-12-10 ENCOUNTER — Encounter (HOSPITAL_COMMUNITY): Payer: Self-pay

## 2014-12-10 DIAGNOSIS — Y9389 Activity, other specified: Secondary | ICD-10-CM | POA: Diagnosis not present

## 2014-12-10 DIAGNOSIS — I10 Essential (primary) hypertension: Secondary | ICD-10-CM | POA: Insufficient documentation

## 2014-12-10 DIAGNOSIS — Y998 Other external cause status: Secondary | ICD-10-CM | POA: Insufficient documentation

## 2014-12-10 DIAGNOSIS — Z043 Encounter for examination and observation following other accident: Secondary | ICD-10-CM

## 2014-12-10 DIAGNOSIS — Z7951 Long term (current) use of inhaled steroids: Secondary | ICD-10-CM | POA: Insufficient documentation

## 2014-12-10 DIAGNOSIS — S8992XA Unspecified injury of left lower leg, initial encounter: Secondary | ICD-10-CM | POA: Insufficient documentation

## 2014-12-10 DIAGNOSIS — Y92410 Unspecified street and highway as the place of occurrence of the external cause: Secondary | ICD-10-CM | POA: Diagnosis not present

## 2014-12-10 DIAGNOSIS — Z880 Allergy status to penicillin: Secondary | ICD-10-CM | POA: Diagnosis not present

## 2014-12-10 DIAGNOSIS — S3992XA Unspecified injury of lower back, initial encounter: Secondary | ICD-10-CM | POA: Insufficient documentation

## 2014-12-10 DIAGNOSIS — M25562 Pain in left knee: Secondary | ICD-10-CM

## 2014-12-10 DIAGNOSIS — Z041 Encounter for examination and observation following transport accident: Secondary | ICD-10-CM

## 2014-12-10 DIAGNOSIS — M545 Low back pain, unspecified: Secondary | ICD-10-CM

## 2014-12-10 MED ORDER — IBUPROFEN 800 MG PO TABS: 800.0000 mg | ORAL_TABLET | Freq: Three times a day (TID) | ORAL | Status: DC

## 2014-12-10 MED ORDER — METHOCARBAMOL 500 MG PO TABS: 500.0000 mg | ORAL_TABLET | Freq: Two times a day (BID) | ORAL | Status: DC | PRN

## 2014-12-10 MED ORDER — HYDROCODONE-ACETAMINOPHEN 5-325 MG PO TABS: 2.0000 | ORAL_TABLET | ORAL | Status: DC | PRN
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
## 2014-12-10 NOTE — ED Notes (Signed)
Crutches and knee sleeve provided by this RN
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
## 2014-12-10 NOTE — ED Notes (Signed)
Pt to radiology.

## 2014-12-10 NOTE — Discharge Instructions (Signed)
Back Pain, Adult Low back pain is very common. About 1 in 5 people have back pain.The cause of low back pain is rarely dangerous. The pain often gets better over time.About half of people with a sudden onset of back pain feel better in just 2 weeks. About 8 in 10 people feel better by 6 weeks.  CAUSES Some common causes of back pain include:  Strain of the muscles or ligaments supporting the spine.  Wear and tear (degeneration) of the spinal discs.  Arthritis.  Direct injury to the back. DIAGNOSIS Most of the time, the direct cause of low back pain is not known.However, back pain can be treated effectively even when the exact cause of the pain is unknown.Answering your caregiver's questions about your overall health and symptoms is one of the most accurate ways to make sure the cause of your pain is not dangerous. If your caregiver needs more information, he or she may order lab work or imaging tests (X-rays or MRIs).However, even if imaging tests show changes in your back, this usually does not require surgery. HOME CARE INSTRUCTIONS For many people, back pain returns.Since low back pain is rarely dangerous, it is often a condition that people can learn to manageon their own.   Remain active. It is stressful on the back to sit or stand in one place. Do not sit, drive, or stand in one place for more than 30 minutes at a time. Take short walks on level surfaces as soon as pain allows.Try to increase the length of time you walk each day.  Do not stay in bed.Resting more than 1 or 2 days can delay your recovery.  Do not avoid exercise or work.Your body is made to move.It is not dangerous to be active, even though your back may hurt.Your back will likely heal faster if you return to being active before your pain is gone.  Pay attention to your body when you bend and lift. Many people have less discomfortwhen lifting if they bend their knees, keep the load close to their bodies,and  avoid twisting. Often, the most comfortable positions are those that put less stress on your recovering back.  Find a comfortable position to sleep. Use a firm mattress and lie on your side with your knees slightly bent. If you lie on your back, put a pillow under your knees.  Only take over-the-counter or prescription medicines as directed by your caregiver. Over-the-counter medicines to reduce pain and inflammation are often the most helpful.Your caregiver may prescribe muscle relaxant drugs.These medicines help dull your pain so you can more quickly return to your normal activities and healthy exercise.  Put ice on the injured area.  Put ice in a plastic bag.  Place a towel between your skin and the bag.  Leave the ice on for 15-20 minutes, 03-04 times a day for the first 2 to 3 days. After that, ice and heat may be alternated to reduce pain and spasms.  Ask your caregiver about trying back exercises and gentle massage. This may be of some benefit.  Avoid feeling anxious or stressed.Stress increases muscle tension and can worsen back pain.It is important to recognize when you are anxious or stressed and learn ways to manage it.Exercise is a great option. SEEK MEDICAL CARE IF:  You have pain that is not relieved with rest or medicine.  You have pain that does not improve in 1 week.  You have new symptoms.  You are generally not feeling well. SEEK   IMMEDIATE MEDICAL CARE IF:   You have pain that radiates from your back into your legs.  You develop new bowel or bladder control problems.  You have unusual weakness or numbness in your arms or legs.  You develop nausea or vomiting.  You develop abdominal pain.  You feel faint. Document Released: 03/29/2005 Document Revised: 09/28/2011 Document Reviewed: 07/31/2013 Exit[employer] Patient Information 2015 [employer]. This information is not intended to replace advice given to you by your health care provider. Make sure you  discuss any questions you have with your health care provider. Knee Pain The knee is the complex joint between your thigh and your lower leg. It is made up of bones, tendons, ligaments, and cartilage. The bones that make up the knee are:  The femur in the thigh.  The tibia and fibula in the lower leg.  The patella or kneecap riding in the groove on the lower femur. CAUSES  Knee pain is a common complaint with many causes. A few of these causes are:  Injury, such as:  A ruptured ligament or tendon injury.  Torn cartilage.  Medical conditions, such as:  Gout  Arthritis  Infections  Overuse, over training, or overdoing a physical activity. Knee pain can be minor or severe. Knee pain can accompany debilitating injury. Minor knee problems often respond well to self-care measures or get well on their own. More serious injuries may need medical intervention or even surgery. SYMPTOMS The knee is complex. Symptoms of knee problems can vary widely. Some of the problems are:  Pain with movement and weight bearing.  Swelling and tenderness.  Buckling of the knee.  Inability to straighten or extend your knee.  Your knee locks and you cannot straighten it.  Warmth and redness with pain and fever.  Deformity or dislocation of the kneecap. DIAGNOSIS  Determining what is wrong may be very straight forward such as when there is an injury. It can also be challenging because of the complexity of the knee. Tests to make a diagnosis may include:  Your caregiver taking a history and doing a physical exam.  Routine X-rays can be used to rule out other problems. X-rays will not reveal a cartilage tear. Some injuries of the knee can be diagnosed by:  Arthroscopy a surgical technique by which a small video camera is inserted through tiny incisions on the sides of the knee. This procedure is used to examine and repair internal knee joint problems. Tiny instruments can be used during  arthroscopy to repair the torn knee cartilage (meniscus).  Arthrography is a radiology technique. A contrast liquid is directly injected into the knee joint. Internal structures of the knee joint then become visible on X-ray film.  An MRI scan is a non X-ray radiology procedure in which magnetic fields and a computer produce two- or three-dimensional images of the inside of the knee. Cartilage tears are often visible using an MRI scanner. MRI scans have largely replaced arthrography in diagnosing cartilage tears of the knee.  Blood work.  Examination of the fluid that helps to lubricate the knee joint (synovial fluid). This is done by taking a sample out using a needle and a syringe. TREATMENT The treatment of knee problems depends on the cause. Some of these treatments are:  Depending on the injury, proper casting, splinting, surgery, or physical therapy care will be needed.  Give yourself adequate recovery time. Do not overuse your joints. If you begin to get sore during workout routines, back off. Slow   down or do fewer repetitions.  For repetitive activities such as cycling or running, maintain your strength and nutrition.  Alternate muscle groups. For example, if you are a weight lifter, work the upper body on one day and the lower body the next.  Either tight or weak muscles do not give the proper support for your knee. Tight or weak muscles do not absorb the stress placed on the knee joint. Keep the muscles surrounding the knee strong.  Take care of mechanical problems.  If you have flat feet, orthotics or special shoes may help. See your caregiver if you need help.  Arch supports, sometimes with wedges on the inner or outer aspect of the heel, can help. These can shift pressure away from the side of the knee most bothered by osteoarthritis.  A brace called an "unloader" brace also may be used to help ease the pressure on the most arthritic side of the knee.  If your caregiver has  prescribed crutches, braces, wraps or ice, use as directed. The acronym for this is PRICE. This means protection, rest, ice, compression, and elevation.  Nonsteroidal anti-inflammatory drugs (NSAIDs), can help relieve pain. But if taken immediately after an injury, they may actually increase swelling. Take NSAIDs with food in your stomach. Stop them if you develop stomach problems. Do not take these if you have a history of ulcers, stomach pain, or bleeding from the bowel. Do not take without your caregiver's approval if you have problems with fluid retention, heart failure, or kidney problems.  For ongoing knee problems, physical therapy may be helpful.  Glucosamine and chondroitin are over-the-counter dietary supplements. Both may help relieve the pain of osteoarthritis in the knee. These medicines are different from the usual anti-inflammatory drugs. Glucosamine may decrease the rate of cartilage destruction.  Injections of a corticosteroid drug into your knee joint may help reduce the symptoms of an arthritis flare-up. They may provide pain relief that lasts a few months. You may have to wait a few months between injections. The injections do have a small increased risk of infection, water retention, and elevated blood sugar levels.  Hyaluronic acid injected into damaged joints may ease pain and provide lubrication. These injections may work by reducing inflammation. A series of shots may give relief for as long as 6 months.  Topical painkillers. Applying certain ointments to your skin may help relieve the pain and stiffness of osteoarthritis. Ask your pharmacist for suggestions. Many over the-counter products are approved for temporary relief of arthritis pain.  In some countries, doctors often prescribe topical NSAIDs for relief of chronic conditions such as arthritis and tendinitis. A review of treatment with NSAID creams found that they worked as well as oral medications but without the serious  side effects. PREVENTION  Maintain a healthy weight. Extra pounds put more strain on your joints.  Get strong, stay limber. Weak muscles are a common cause of knee injuries. Stretching is important. Include flexibility exercises in your workouts.  Be smart about exercise. If you have osteoarthritis, chronic knee pain or recurring injuries, you may need to change the way you exercise. This does not mean you have to stop being active. If your knees ache after jogging or playing basketball, consider switching to swimming, water aerobics, or other low-impact activities, at least for a few days a week. Sometimes limiting high-impact activities will provide relief.  Make sure your shoes fit well. Choose footwear that is right for your sport.  Protect your knees. Use the   proper gear for knee-sensitive activities. Use kneepads when playing volleyball or laying carpet. Buckle your seat belt every time you drive. Most shattered kneecaps occur in car accidents.  Rest when you are tired. SEEK MEDICAL CARE IF:  You have knee pain that is continual and does not seem to be getting better.  SEEK IMMEDIATE MEDICAL CARE IF:  Your knee joint feels hot to the touch and you have a high fever. MAKE SURE YOU:   Understand these instructions.  Will watch your condition.  Will get help right away if you are not doing well or get worse. Document Released: 01/24/2007 Document Revised: 06/21/2011 Document Reviewed: 01/24/2007 Exit[employer] Patient Information 2015 [employer]. This information is not intended to replace advice given to you by your health care provider. Make sure you discuss any questions you have with your health care provider.  

## 2015-08-01 ENCOUNTER — Emergency Department (HOSPITAL_COMMUNITY): Payer: Medicaid - Out of State

## 2015-08-01 ENCOUNTER — Other Ambulatory Visit: Payer: Self-pay

## 2015-08-01 ENCOUNTER — Emergency Department (HOSPITAL_COMMUNITY)
Admission: EM | Admit: 2015-08-01 | Discharge: 2015-08-01 | Disposition: A | Discharge status: Home / Self Care | Payer: Medicaid - Out of State | Attending: Emergency Medicine | Admitting: Emergency Medicine

## 2015-08-01 ENCOUNTER — Encounter (HOSPITAL_COMMUNITY): Payer: Self-pay | Admitting: *Deleted

## 2015-08-01 DIAGNOSIS — Z880 Allergy status to penicillin: Secondary | ICD-10-CM | POA: Insufficient documentation

## 2015-08-01 DIAGNOSIS — R0602 Shortness of breath: Secondary | ICD-10-CM | POA: Insufficient documentation

## 2015-08-01 DIAGNOSIS — I10 Essential (primary) hypertension: Secondary | ICD-10-CM | POA: Diagnosis not present

## 2015-08-01 DIAGNOSIS — R079 Chest pain, unspecified: Secondary | ICD-10-CM | POA: Diagnosis present

## 2015-08-01 DIAGNOSIS — Z791 Long term (current) use of non-steroidal anti-inflammatories (NSAID): Secondary | ICD-10-CM | POA: Insufficient documentation

## 2015-08-01 LAB — CBC
HCT: 41.3 % (ref 36.0–46.0)
Hemoglobin: 13.5 g/dL (ref 12.0–15.0)
MCH: 28.6 pg (ref 26.0–34.0)
MCHC: 32.7 g/dL (ref 30.0–36.0)
MCV: 87.5 fL (ref 78.0–100.0)
PLATELETS: 237 10*3/uL (ref 150–400)
RBC: 4.72 MIL/uL (ref 3.87–5.11)
RDW: 13.8 % (ref 11.5–15.5)
WBC: 5.3 10*3/uL (ref 4.0–10.5)

## 2015-08-01 LAB — BASIC METABOLIC PANEL
Anion gap: 9 (ref 5–15)
BUN: 10 mg/dL (ref 6–20)
CALCIUM: 9.3 mg/dL (ref 8.9–10.3)
CHLORIDE: 108 mmol/L (ref 101–111)
CO2: 21 mmol/L — AB (ref 22–32)
CREATININE: 0.99 mg/dL (ref 0.44–1.00)
GFR calc Af Amer: >60 mL/min (ref >60)
GFR calc non Af Amer: >60 mL/min (ref >60)
Glucose, Bld: 89 mg/dL (ref 65–99)
Potassium: 3.6 mmol/L (ref 3.5–5.1)
SODIUM: 138 mmol/L (ref 135–145)

## 2015-08-01 LAB — I-STAT TROPONIN, ED
Troponin i, poc: 0 ng/mL (ref 0.00–0.08)
Troponin i, poc: 0 ng/mL (ref 0.00–0.08)

## 2015-08-01 MED ORDER — ASPIRIN 81 MG PO CHEW
324.0000 mg | CHEWABLE_TABLET | Freq: Once | ORAL | Status: AC
  Administered 2015-08-01: 324 mg via ORAL
  Filled 2015-08-01: qty 4

## 2015-08-01 MED ORDER — NITROGLYCERIN 0.4 MG SL SUBL: 0.4000 mg | SUBLINGUAL_TABLET | SUBLINGUAL | Status: DC | PRN

## 2015-08-01 MED ORDER — LORAZEPAM 2 MG/ML IJ SOLN
1.0000 mg | Freq: Once | INTRAMUSCULAR | Status: AC
  Administered 2015-08-01: 1 mg via INTRAVENOUS
  Filled 2015-08-01: qty 1

## 2015-08-01 NOTE — ED Notes (Signed)
Patient transported to radiology
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
## 2015-08-01 NOTE — Discharge Instructions (Signed)
Try zantac 150mg twice a day.  °Nonspecific Chest Pain  °Chest pain can be caused by many different conditions. There is always a chance that your pain could be related to something serious, such as a heart attack or a blood clot in your lungs. Chest pain can also be caused by conditions that are not life-threatening. If you have chest pain, it is very important to follow up with your health care provider. °CAUSES  °Chest pain can be caused by: °· Heartburn. °· Pneumonia or bronchitis. °· Anxiety or stress. °· Inflammation around your heart (pericarditis) or lung (pleuritis or pleurisy). °· A blood clot in your lung. °· A collapsed lung (pneumothorax). It can develop suddenly on its own (spontaneous pneumothorax) or from trauma to the chest. °· Shingles infection (varicella-zoster virus). °· Heart attack. °· Damage to the bones, muscles, and cartilage that make up your chest wall. This can include: °¨ Bruised bones due to injury. °¨ Strained muscles or cartilage due to frequent or repeated coughing or overwork. °¨ Fracture to one or more ribs. °¨ Sore cartilage due to inflammation (costochondritis). °RISK FACTORS  °Risk factors for chest pain may include: °· Activities that increase your risk for trauma or injury to your chest. °· Respiratory infections or conditions that cause frequent coughing. °· Medical conditions or overeating that can cause heartburn. °· Heart disease or family history of heart disease. °· Conditions or health behaviors that increase your risk of developing a blood clot. °· Having had chicken pox (varicella zoster). °SIGNS AND SYMPTOMS °Chest pain can feel like: °· Burning or tingling on the surface of your chest or deep in your chest. °· Crushing, pressure, aching, or squeezing pain. °· Dull or sharp pain that is worse when you move, cough, or take a deep breath. °· Pain that is also felt in your back, neck, shoulder, or arm, or pain that spreads to any of these areas. °Your chest pain may  come and go, or it may stay constant. °DIAGNOSIS °Lab tests or other studies may be needed to find the cause of your pain. Your health care provider may have you take a test called an ambulatory ECG (electrocardiogram). An ECG records your heartbeat patterns at the time the test is performed. You may also have other tests, such as: °· Transthoracic echocardiogram (TTE). During echocardiography, sound waves are used to create a picture of all of the heart structures and to look at how blood flows through your heart. °· Transesophageal echocardiogram (TEE). This is a more advanced imaging test that obtains images from inside your body. It allows your health care provider to see your heart in finer detail. °· Cardiac monitoring. This allows your health care provider to monitor your heart rate and rhythm in real time. °· Holter monitor. This is a portable device that records your heartbeat and can help to diagnose abnormal heartbeats. It allows your health care provider to track your heart activity for several days, if needed. °· Stress tests. These can be done through exercise or by taking medicine that makes your heart beat more quickly. °· Blood tests. °· Imaging tests. °TREATMENT  °Your treatment depends on what is causing your chest pain. Treatment may include: °· Medicines. These may include: °¨ Acid blockers for heartburn. °¨ Anti-inflammatory medicine. °¨ Pain medicine for inflammatory conditions. °¨ Antibiotic medicine, if an infection is present. °¨ Medicines to dissolve blood clots. °¨ Medicines to treat coronary artery disease. °· Supportive care for conditions that do not require medicines. This   may include: °¨ Resting. °¨ Applying heat or cold packs to injured areas. °¨ Limiting activities until pain decreases. °HOME CARE INSTRUCTIONS °· If you were prescribed an antibiotic medicine, finish it all even if you start to feel better. °· Avoid any activities that bring on chest pain. °· Do not use any tobacco  products, including cigarettes, chewing tobacco, or electronic cigarettes. If you need help quitting, ask your health care provider. °· Do not drink alcohol. °· Take medicines only as directed by your health care provider. °· Keep all follow-up visits as directed by your health care provider. This is important. This includes any further testing if your chest pain does not go away. °· If heartburn is the cause for your chest pain, you may be told to keep your head raised (elevated) while sleeping. This reduces the chance that acid will go from your stomach into your esophagus. °· Make lifestyle changes as directed by your health care provider. These may include: °¨ Getting regular exercise. Ask your health care provider to suggest some activities that are safe for you. °¨ Eating a heart-healthy diet. A registered dietitian can help you to learn healthy eating options. °¨ Maintaining a healthy weight. °¨ Managing diabetes, if necessary. °¨ Reducing stress. °SEEK MEDICAL CARE IF: °· Your chest pain does not go away after treatment. °· You have a rash with blisters on your chest. °· You have a fever. °SEEK IMMEDIATE MEDICAL CARE IF:  °· Your chest pain is worse. °· You have an increasing cough, or you cough up blood. °· You have severe abdominal pain. °· You have severe weakness. °· You faint. °· You have chills. °· You have sudden, unexplained chest discomfort. °· You have sudden, unexplained discomfort in your arms, back, neck, or jaw. °· You have shortness of breath at any time. °· You suddenly start to sweat, or your skin gets clammy. °· You feel nauseous or you vomit. °· You suddenly feel light-headed or dizzy. °· Your heart begins to beat quickly, or it feels like it is skipping beats. °These symptoms may represent a serious problem that is an emergency. Do not wait to see if the symptoms will go away. Get medical help right away. Call your local emergency services (911 in the [place] Do not drive yourself to the  hospital. °  °This information is not intended to replace advice given to you by your health care provider. Make sure you discuss any questions you have with your health care provider. °  °Document Released: 01/06/2005 Document Revised: 04/19/2014 Document Reviewed: 11/02/2013 °Elsevier Interactive Patient Education ©2016 [employer] ° °

## 2015-08-01 NOTE — ED Notes (Signed)
Pt c/o a headache. Pt states she just needs sleep.

## 2015-08-01 NOTE — ED Provider Notes (Signed)
CSN: [id number]     Arrival date & time 08/01/15  1013 History   First MD Initiated Contact with Patient 08/01/15 1023     Chief Complaint  Patient presents with  . Chest Pain     (Consider location/radiation/quality/duration/timing/severity/associated sxs/prior Treatment) Patient is a 45 y.o. female presenting with chest pain. The history is provided by the patient.  Chest Pain Pain location:  L chest Pain quality: pressure   Pain radiates to the back: no   Pain severity:  Moderate Onset quality:  Sudden Duration:  12 hours Timing:  Intermittent Progression:  Waxing and waning Chronicity:  New Context: stress   Relieved by:  Nothing Worsened by:  Nothing tried Ineffective treatments:  None tried Associated symptoms: shortness of breath   Associated symptoms: no dizziness, no fever, no headache, no nausea, no palpitations and not vomiting   Risk factors: hypertension   Risk factors: no coronary artery disease, no high cholesterol, no prior DVT/PE and no smoking    44 yo F With a chief complaint of chest pain. She says it feels like she's having a heart attack. She has no prior history to compare that to. Left-sided feels like a pressure denies radiation. Some shortness of breath associated with it. History of hypertension but denies other coronary risk factors.  Past Medical History  Diagnosis Date  . Hypertension    Past Surgical History  Procedure Laterality Date  . Tubal ligation    . Laparoscopic gastric sleeve resection     No family history on file. Social History  Substance Use Topics  . Smoking status: Never Smoker   . Smokeless tobacco: Never Used  . Alcohol Use: No   OB History    No data available     Review of Systems  Constitutional: Negative for fever and chills.  HENT: Negative for congestion and rhinorrhea.   Eyes: Negative for redness and visual disturbance.  Respiratory: Positive for shortness of breath. Negative for wheezing.    Cardiovascular: Positive for chest pain. Negative for palpitations.  Gastrointestinal: Negative for nausea and vomiting.  Genitourinary: Negative for dysuria and urgency.  Musculoskeletal: Negative for myalgias and arthralgias.  Skin: Negative for pallor and wound.  Neurological: Negative for dizziness and headaches.      Allergies  Penicillins  Home Medications   Prior to Admission medications   Medication Sig Start Date End Date Taking? Authorizing Provider  ibuprofen (ADVIL,MOTRIN) 800 MG tablet Take 1 tablet (800 mg total) by mouth 3 (three) times daily. 12/10/14  Yes Danelle Berry, PA-C  lisinopril (PRINIVIL,ZESTRIL) 5 MG tablet TK 1 T PO QD 08/22/14  Yes Historical Provider, MD  chlorpheniramine-HYDROcodone (TUSSIONEX PENNKINETIC ER) 10-8 MG/5ML LQCR Take 5 mLs by mouth every 12 (twelve) hours as needed for cough (Cough). Patient not taking: Reported on 04/14/2014 10/08/13   Junious Silk, PA-C  cyclobenzaprine (FLEXERIL) 10 MG tablet Take 1 tablet (10 mg total) by mouth 2 (two) times daily as needed for muscle spasms. Patient not taking: Reported on 11/16/2014 04/14/14   Gwyneth Sprout, MD  fluticasone (FLONASE) 50 MCG/ACT nasal spray Place 2 sprays into both nostrils daily. Patient not taking: Reported on 04/14/2014 10/08/13   Junious Silk, PA-C  HYDROcodone-acetaminophen (NORCO/VICODIN) 5-325 MG per tablet Take 2 tablets by mouth every 4 (four) hours as needed. 12/10/14   Danelle Berry, PA-C  methocarbamol (ROBAXIN) 500 MG tablet Take 1 tablet (500 mg total) by mouth 2 (two) times daily as needed for muscle spasms. 12/10/14   Sheliah Mends  Tapia, PA-C   BP 128/72 mmHg  Pulse 65  Temp(Src) 98.1 F (36.7 C) (Oral)  Resp 20  Ht 5\' 9"  (1.753 m)  SpO2 99%  LMP 07/04/2015 Physical Exam  Constitutional: She is oriented to person, place, and time. She appears well-developed and well-nourished. No distress.  HENT:  Head: Normocephalic and atraumatic.  Eyes: EOM are normal. Pupils are equal,  round, and reactive to light.  Neck: Normal range of motion. Neck supple.  Cardiovascular: Normal rate and regular rhythm.  Exam reveals no gallop and no friction rub.   No murmur heard. Pulmonary/Chest: Effort normal. She has no wheezes. She has no rales.  Abdominal: Soft. She exhibits no distension. There is no tenderness. There is no rebound.  Musculoskeletal: She exhibits no edema or tenderness.  Neurological: She is alert and oriented to person, place, and time.  Skin: Skin is warm and dry. She is not diaphoretic.  Psychiatric: She has a normal mood and affect. Her behavior is normal.  Nursing note and vitals reviewed.   ED Course  Procedures (including critical care time) Labs Review Labs Reviewed  BASIC METABOLIC PANEL - Abnormal; Notable for the following:    CO2 21 (*)    All other components within normal limits  CBC  I-STAT TROPOININ, ED  Rosezena SensorI-STAT TROPOININ, ED    Imaging Review Dg Chest 2 View  08/01/2015  CLINICAL DATA:  Chest pain and tightness.  History of hypertension. EXAM: CHEST - 2 VIEW COMPARISON:  10/08/2013 FINDINGS: The heart size and mediastinal contours are within normal limits. There is no evidence of pulmonary edema, consolidation, pneumothorax, nodule or pleural fluid. The visualized skeletal structures are unremarkable. IMPRESSION: No active disease. Electronically Signed   By: Irish LackGlenn  Yamagata M.D.   On: 08/01/2015 10:53   I have personally reviewed and evaluated these images and lab results as part of my medical decision-making.   EKG Interpretation   Date/Time:  Friday August 01 2015 10:20:20 EDT Ventricular Rate:  79 PR Interval:  156 QRS Duration: 70 QT Interval:  368 QTC Calculation: 421 R Axis:   51 Text Interpretation:  Normal sinus rhythm with sinus arrhythmia Anterior  infarct , age undetermined Abnormal ECG No old tracing to compare  Confirmed by Ayce Pietrzyk MD, DANIEL [phone]([id number]) on 08/01/2015 11:06:25 AM      MDM   Final diagnoses:  Chest  pain, unspecified chest pain type    45 yo F with a chief complaint of chest pain. Delta troponin is negative. PERC negative. D/c home.   2:45 PM:  I have discussed the diagnosis/risks/treatment options with the patient and family and believe the pt to be eligible for discharge home to follow-up with PCP. We also discussed returning to the ED immediately if new or worsening sx occur. We discussed the sx which are most concerning (e.g., sudden worsening pain, fever, inability to tolerate by mouth) that necessitate immediate return. Medications administered to the patient during their visit and any new prescriptions provided to the patient are listed below.  Medications given during this visit Medications  nitroGLYCERIN (NITROSTAT) SL tablet 0.4 mg (not administered)  aspirin chewable tablet 324 mg (324 mg Oral Given 08/01/15 1128)  LORazepam (ATIVAN) injection 1 mg (1 mg Intravenous Given 08/01/15 1128)    Discharge Medication List as of 08/01/2015  2:10 PM      The patient appears reasonably screen and/or stabilized for discharge and I doubt any other medical condition or other [facility] requiring further screening, evaluation, or treatment  in the ED at this time prior to discharge.        Melene Plan, DO 08/01/15 1445

## 2015-11-23 IMAGING — CR DG HIP (WITH OR WITHOUT PELVIS) 2-3V*R*
3 series · 3 of 3 positions shown · non-contrast
Comparison: None.

CLINICAL DATA: Status post fall, with injury to the right hip.
Initial encounter.

EXAM:
DG HIP (WITH OR WITHOUT PELVIS) 2-3V RIGHT

[t pelvis ap]
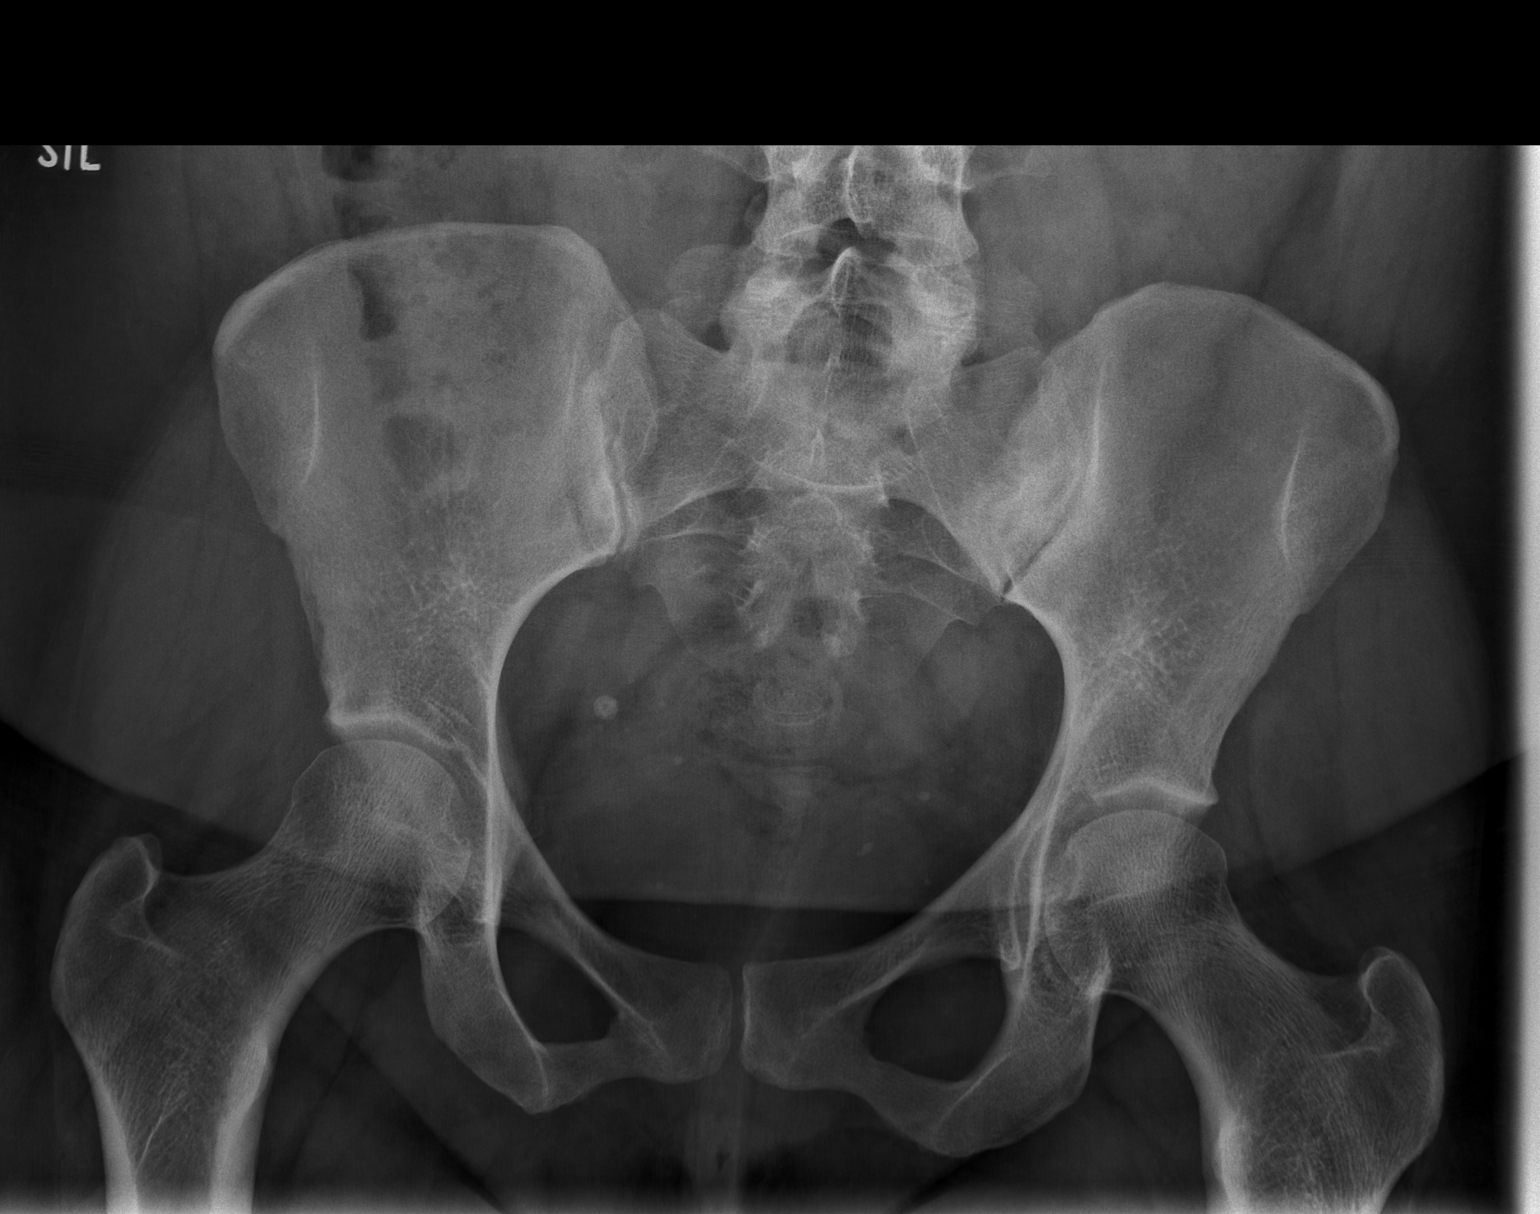

[t hip ap right]
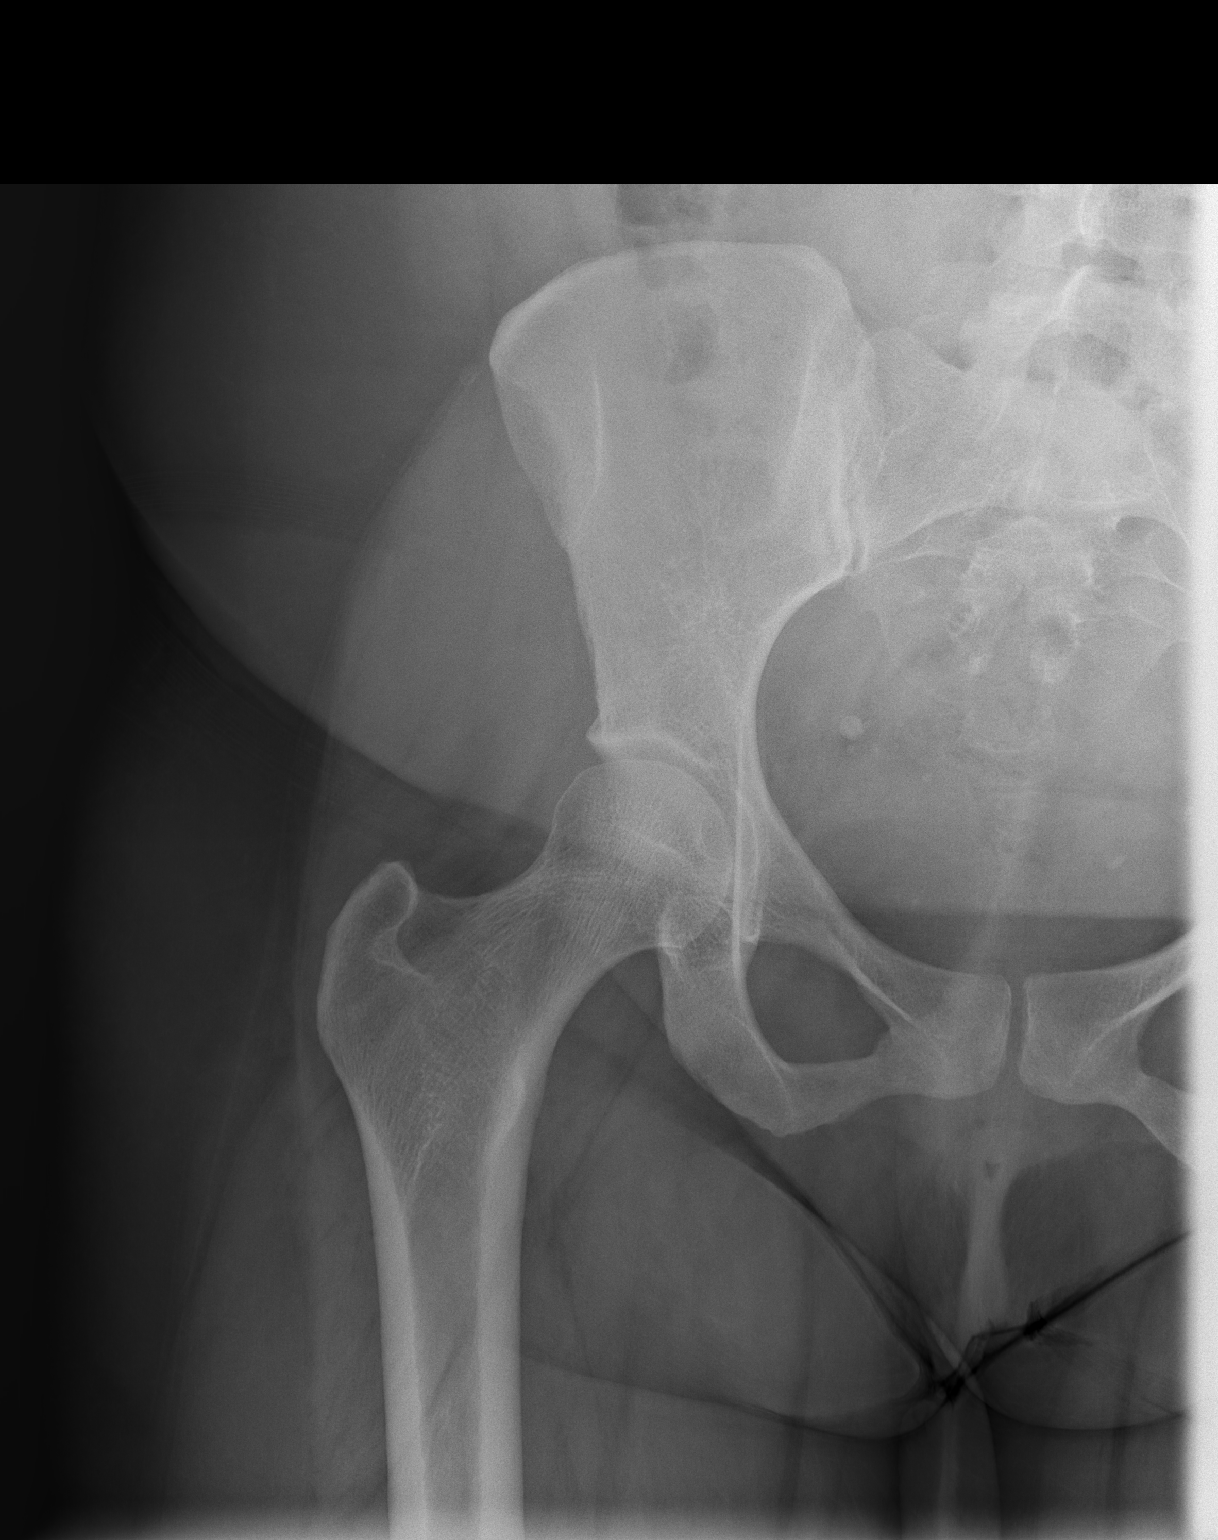

[t hip frog leg right]
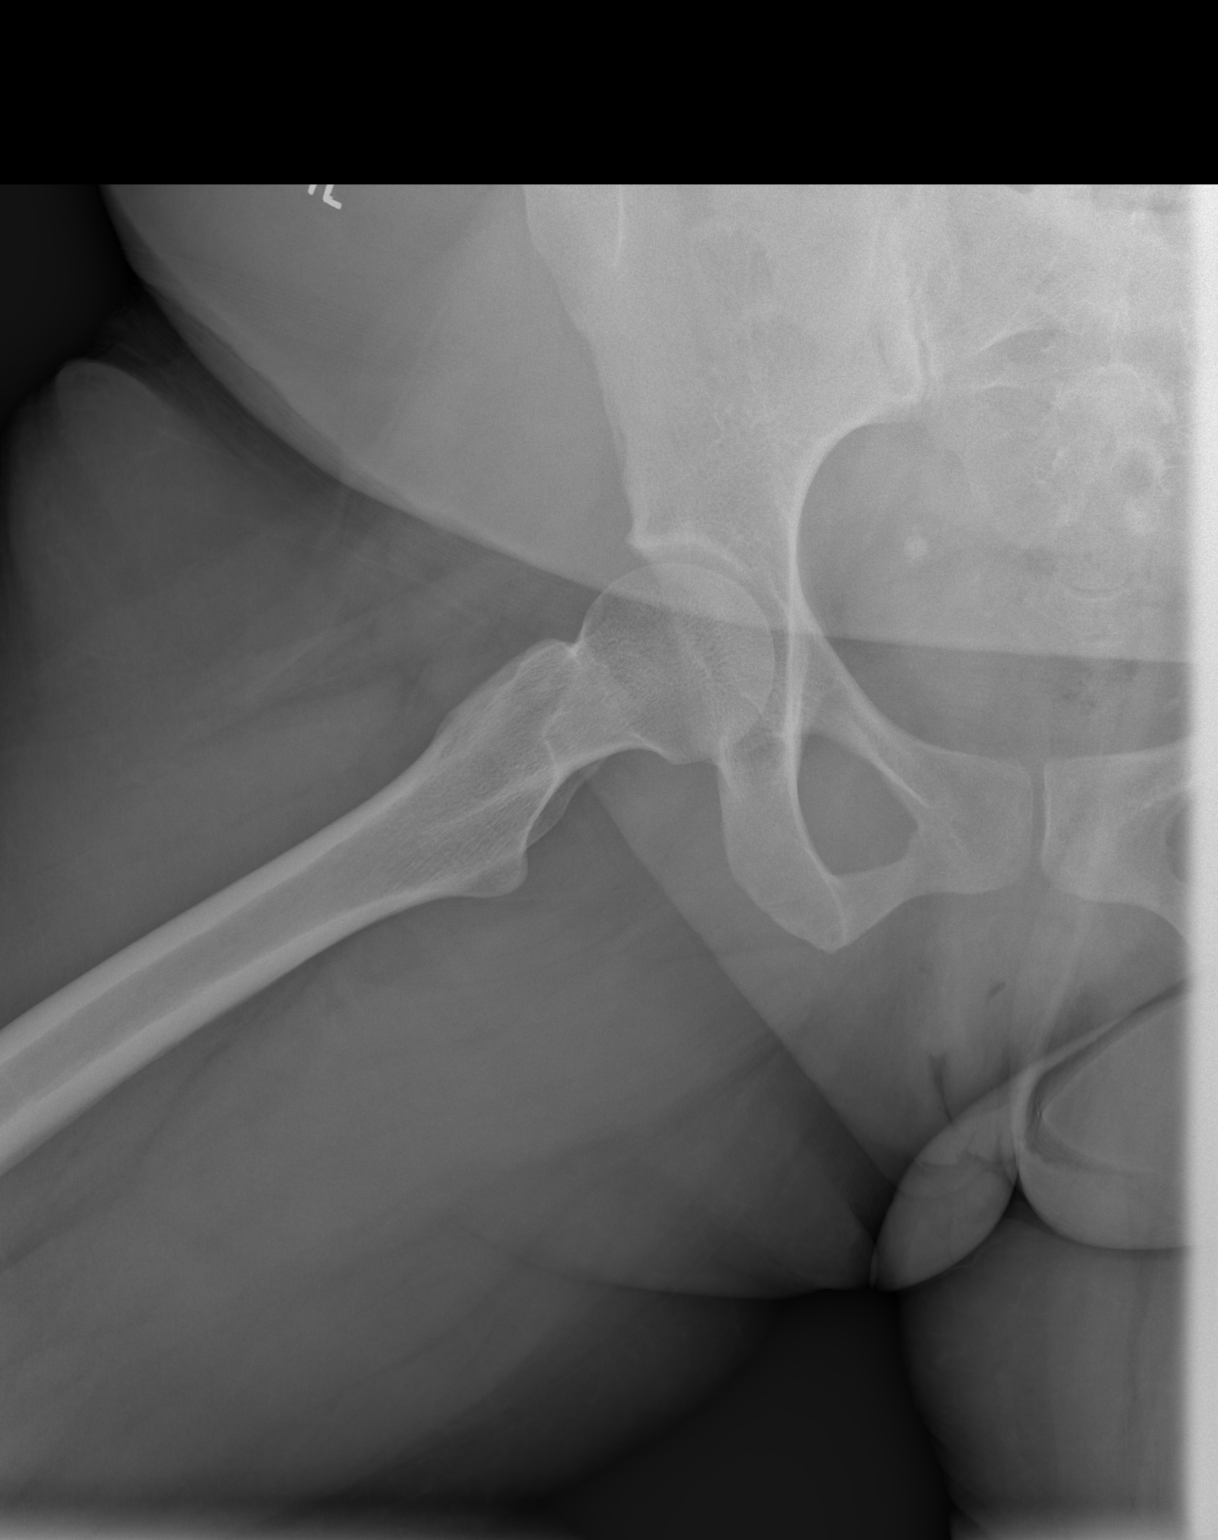

[3 of 3 positions shown; findings below may reference images not displayed]

FINDINGS: There is no evidence of fracture or dislocation. Both femoral heads
are seated normally within their respective acetabula. The proximal
right femur appears intact. No significant degenerative change is
appreciated. The sacroiliac joints are unremarkable in appearance.

The visualized bowel gas pattern is grossly unremarkable in
appearance. Scattered phleboliths are noted within the pelvis.
IMPRESSION: No evidence of fracture or dislocation.

## 2016-03-29 ENCOUNTER — Emergency Department (HOSPITAL_COMMUNITY)
Admission: EM | Admit: 2016-03-29 | Discharge: 2016-03-29 | Disposition: A | Discharge status: Home / Self Care | Payer: Medicaid - Out of State | Attending: Emergency Medicine | Admitting: Emergency Medicine

## 2016-03-29 ENCOUNTER — Emergency Department (HOSPITAL_COMMUNITY): Payer: Medicaid - Out of State

## 2016-03-29 ENCOUNTER — Encounter (HOSPITAL_COMMUNITY): Payer: Self-pay | Admitting: Emergency Medicine

## 2016-03-29 DIAGNOSIS — Z5321 Procedure and treatment not carried out due to patient leaving prior to being seen by health care provider: Secondary | ICD-10-CM | POA: Insufficient documentation

## 2016-03-29 DIAGNOSIS — R079 Chest pain, unspecified: Secondary | ICD-10-CM | POA: Insufficient documentation

## 2016-03-29 LAB — CBC
HCT: 38.6 % (ref 36.0–46.0)
Hemoglobin: 12.9 g/dL (ref 12.0–15.0)
MCH: 28.8 pg (ref 26.0–34.0)
MCHC: 33.4 g/dL (ref 30.0–36.0)
MCV: 86.2 fL (ref 78.0–100.0)
PLATELETS: 275 10*3/uL (ref 150–400)
RBC: 4.48 MIL/uL (ref 3.87–5.11)
RDW: 13.6 % (ref 11.5–15.5)
WBC: 5.4 10*3/uL (ref 4.0–10.5)

## 2016-03-29 LAB — I-STAT TROPONIN, ED: Troponin i, poc: 0 ng/mL (ref 0.00–0.08)

## 2016-03-29 LAB — BASIC METABOLIC PANEL
Anion gap: 9 (ref 5–15)
BUN: 11 mg/dL (ref 6–20)
CO2: 24 mmol/L (ref 22–32)
CREATININE: 0.91 mg/dL (ref 0.44–1.00)
Calcium: 8.8 mg/dL — ABNORMAL LOW (ref 8.9–10.3)
Chloride: 108 mmol/L (ref 101–111)
GFR calc Af Amer: >60 mL/min (ref >60)
Glucose, Bld: 88 mg/dL (ref 65–99)
Potassium: 4.1 mmol/L (ref 3.5–5.1)
SODIUM: 141 mmol/L (ref 135–145)

## 2016-03-29 NOTE — ED Triage Notes (Addendum)
Pt arrives with c/o cp intermittent x1 month, worse in the last several hours. States that she has trouble catching her breath. States new onset headache in the last several hours. Denies cough, fevers.   Pt on cell phone throughout triage.

## 2016-03-29 NOTE — ED Notes (Addendum)
Pt comes out of room and states "where is the exit, I am leaving." Asked pt if I could talk to her in her room but pt said she wanted to leave and will not talk. Pt would not sign AMA document. Pt proceeded to briskly walk out. Physician notified

## 2016-03-29 NOTE — ED Notes (Signed)
Patient transported to X-ray 

## 2017-09-12 ENCOUNTER — Emergency Department (HOSPITAL_COMMUNITY)
Admission: EM | Admit: 2017-09-12 | Discharge: 2017-09-13 | Disposition: A | Discharge status: Home / Self Care | Payer: Medicaid - Out of State | Attending: Emergency Medicine | Admitting: Emergency Medicine

## 2017-09-12 ENCOUNTER — Encounter (HOSPITAL_COMMUNITY): Payer: Self-pay | Admitting: Emergency Medicine

## 2017-09-12 DIAGNOSIS — R519 Headache, unspecified: Secondary | ICD-10-CM

## 2017-09-12 DIAGNOSIS — F32A Depression, unspecified: Secondary | ICD-10-CM

## 2017-09-12 DIAGNOSIS — Z79899 Other long term (current) drug therapy: Secondary | ICD-10-CM | POA: Insufficient documentation

## 2017-09-12 DIAGNOSIS — F1414 Cocaine abuse with cocaine-induced mood disorder: Secondary | ICD-10-CM | POA: Insufficient documentation

## 2017-09-12 DIAGNOSIS — R51 Headache: Secondary | ICD-10-CM

## 2017-09-12 DIAGNOSIS — N390 Urinary tract infection, site not specified: Secondary | ICD-10-CM | POA: Insufficient documentation

## 2017-09-12 DIAGNOSIS — I10 Essential (primary) hypertension: Secondary | ICD-10-CM | POA: Insufficient documentation

## 2017-09-12 DIAGNOSIS — F329 Major depressive disorder, single episode, unspecified: Secondary | ICD-10-CM | POA: Insufficient documentation

## 2017-09-12 DIAGNOSIS — R319 Hematuria, unspecified: Secondary | ICD-10-CM | POA: Insufficient documentation

## 2017-09-12 DIAGNOSIS — F17210 Nicotine dependence, cigarettes, uncomplicated: Secondary | ICD-10-CM | POA: Insufficient documentation

## 2017-09-12 DIAGNOSIS — F1910 Other psychoactive substance abuse, uncomplicated: Secondary | ICD-10-CM

## 2017-09-12 LAB — CBC WITH DIFFERENTIAL/PLATELET
Basophils Absolute: 0 10*3/uL (ref 0.0–0.1)
Basophils Relative: 0 %
EOS PCT: 0 %
Eosinophils Absolute: 0 10*3/uL (ref 0.0–0.7)
HCT: 39.8 % (ref 36.0–46.0)
HEMOGLOBIN: 13 g/dL (ref 12.0–15.0)
LYMPHS ABS: 1.6 10*3/uL (ref 0.7–4.0)
LYMPHS PCT: 17 %
MCH: 28 pg (ref 26.0–34.0)
MCHC: 32.7 g/dL (ref 30.0–36.0)
MCV: 85.8 fL (ref 78.0–100.0)
Monocytes Absolute: 0.8 10*3/uL (ref 0.1–1.0)
Monocytes Relative: 9 %
NEUTROS ABS: 6.9 10*3/uL (ref 1.7–7.7)
NEUTROS PCT: 74 %
PLATELETS: 278 10*3/uL (ref 150–400)
RBC: 4.64 MIL/uL (ref 3.87–5.11)
RDW: 14.3 % (ref 11.5–15.5)
WBC: 9.4 10*3/uL (ref 4.0–10.5)

## 2017-09-12 LAB — URINALYSIS, ROUTINE W REFLEX MICROSCOPIC
Bilirubin Urine: NEGATIVE
Glucose, UA: NEGATIVE mg/dL
Ketones, ur: 5 mg/dL — AB
Nitrite: POSITIVE — AB
PROTEIN: NEGATIVE mg/dL
Specific Gravity, Urine: 1.015 (ref 1.005–1.030)
pH: 5 (ref 5.0–8.0)

## 2017-09-12 LAB — COMPREHENSIVE METABOLIC PANEL
ALK PHOS: 68 U/L (ref 38–126)
ALT: 19 U/L (ref 14–54)
AST: 20 U/L (ref 15–41)
Albumin: 4.2 g/dL (ref 3.5–5.0)
Anion gap: 10 (ref 5–15)
BUN: 13 mg/dL (ref 6–20)
CALCIUM: 8.8 mg/dL — AB (ref 8.9–10.3)
CHLORIDE: 107 mmol/L (ref 101–111)
CO2: 21 mmol/L — AB (ref 22–32)
CREATININE: 0.96 mg/dL (ref 0.44–1.00)
Glucose, Bld: 80 mg/dL (ref 65–99)
Potassium: 3.8 mmol/L (ref 3.5–5.1)
Sodium: 138 mmol/L (ref 135–145)
Total Bilirubin: 0.5 mg/dL (ref 0.3–1.2)
Total Protein: 7.5 g/dL (ref 6.5–8.1)

## 2017-09-12 LAB — RAPID URINE DRUG SCREEN, HOSP PERFORMED
AMPHETAMINES: NOT DETECTED
BENZODIAZEPINES: NOT DETECTED
Barbiturates: NOT DETECTED
Cocaine: POSITIVE — AB
OPIATES: NOT DETECTED
Tetrahydrocannabinol: NOT DETECTED

## 2017-09-12 LAB — ETHANOL: Alcohol, Ethyl (B): <10 mg/dL (ref <10)

## 2017-09-12 LAB — PREGNANCY, URINE: PREG TEST UR: NEGATIVE

## 2017-09-12 LAB — ACETAMINOPHEN LEVEL: Acetaminophen (Tylenol), Serum: <10 ug/mL — ABNORMAL LOW (ref 10–30)

## 2017-09-12 LAB — SALICYLATE LEVEL: Salicylate Lvl: <7 mg/dL (ref 2.8–30.0)

## 2017-09-12 MED ORDER — DIPHENHYDRAMINE HCL 25 MG PO CAPS
25.0000 mg | ORAL_CAPSULE | Freq: Once | ORAL | Status: AC
  Administered 2017-09-12: 25 mg via ORAL
  Filled 2017-09-12: qty 1

## 2017-09-12 MED ORDER — PROCHLORPERAZINE MALEATE 5 MG PO TABS
5.0000 mg | ORAL_TABLET | Freq: Once | ORAL | Status: AC
  Administered 2017-09-12: 5 mg via ORAL
  Filled 2017-09-12 (×2): qty 1

## 2017-09-12 MED ORDER — KETOROLAC TROMETHAMINE 15 MG/ML IJ SOLN
15.0000 mg | Freq: Once | INTRAMUSCULAR | Status: AC
  Administered 2017-09-12: 15 mg via INTRAVENOUS
  Filled 2017-09-12: qty 1

## 2017-09-12 MED ORDER — NITROFURANTOIN MONOHYD MACRO 100 MG PO CAPS
100.0000 mg | ORAL_CAPSULE | Freq: Two times a day (BID) | ORAL | Status: DC
  Administered 2017-09-13: 100 mg via ORAL
  Filled 2017-09-12 (×2): qty 1

## 2017-09-12 MED ORDER — LISINOPRIL 5 MG PO TABS
5.0000 mg | ORAL_TABLET | Freq: Every day | ORAL | Status: DC
  Administered 2017-09-13: 5 mg via ORAL
  Filled 2017-09-12: qty 1

## 2017-09-12 MED ORDER — NITROFURANTOIN MONOHYD MACRO 100 MG PO CAPS
100.0000 mg | ORAL_CAPSULE | Freq: Two times a day (BID) | ORAL | Status: DC
  Filled 2017-09-12: qty 1

## 2017-09-12 NOTE — ED Notes (Signed)
Bed: [facility] Expected date:  Expected time:  Means of arrival:  Comments: 

## 2017-09-12 NOTE — ED Notes (Signed)
On admission to Acute Unit pt is calm and cooperative.
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
## 2017-09-12 NOTE — ED Triage Notes (Signed)
Pt comes in c/o of headache x 3 days and requesting for detox off cocaine/crack. Pt very tearful and states that she was clean for 15 years and been using again over the past 8 months. Denies SI or HI. States that she has Hx mental health and been off her medications for a long time and needing help with her depression.

## 2017-09-12 NOTE — ED Provider Notes (Signed)
[facility]-EMERGENCY DEPT Provider Note   CSN: [id number] Arrival date & time: 09/12/17  1449     History   Chief Complaint Chief Complaint  Patient presents with  . Headache  . detox  . Depression    HPI Jones Broomeresa M Dado is a 47 y.o. female.  HPI   Patient is a 47 year old female who presents to the emergency department today to be evaluated for headache and depression.  She states she has had a frontal headache for the last 3 days that has been intermittent in nature.  Gradual in onset and not worse headache of life.  Denies any associated vision changes, dizziness, lightheadedness numbness or weakness to her arms or legs.  She has not tried taking any medications or using any other interventions for her symptoms. Reports some chills and fatigue.  She denies any chest pain or shortness of breath.  No recent falls or trauma.  No documented fevers, abdominal pain, nausea, vomiting, diarrhea.  She reports some frequency with urination and intermittent stress incontinence, but denies any burning with urination.  No abnormal vaginal discharge.  She states she has a history of schizophrenia and she has not been on any medication for several years.  She also has a history of substance abuse and has been clean for the last 15 years however has been feeling very depressed recently and has been using cocaine and marijuana.  States she has had some passive thoughts of suicide recently she has been so depressed.  She states she does not have a specific plan in place.  She is crying during my evaluation.  She denies any ideations or hallucinations.  Past Medical History:  Diagnosis Date  . Hypertension     There are no active problems to display for this patient.   Past Surgical History:  Procedure Laterality Date  . LAPAROSCOPIC GASTRIC SLEEVE RESECTION    . TUBAL LIGATION       OB History   None      Home Medications    Prior to Admission medications     Medication Sig Start Date End Date Taking? Authorizing Provider  lisinopril (PRINIVIL,ZESTRIL) 5 MG tablet TK 1 T PO QD 08/22/14  Yes [provider]  chlorpheniramine-HYDROcodone (TUSSIONEX PENNKINETIC ER) 10-8 MG/5ML LQCR Take 5 mLs by mouth every 12 (twelve) hours as needed for cough (Cough). Patient not taking: Reported on 04/14/2014 10/08/13   Junious SilkMerrell, Hannah, PA-C  cyclobenzaprine (FLEXERIL) 10 MG tablet Take 1 tablet (10 mg total) by mouth 2 (two) times daily as needed for muscle spasms. Patient not taking: Reported on 11/16/2014 04/14/14   Gwyneth SproutPlunkett, Whitney, MD  fluticasone [facility](FLONASE) 50 MCG/ACT nasal spray Place 2 sprays into both nostrils daily. Patient not taking: Reported on 04/14/2014 10/08/13   Junious SilkMerrell, Hannah, PA-C  HYDROcodone-acetaminophen (NORCO/VICODIN) 5-325 MG per tablet Take 2 tablets by mouth every 4 (four) hours as needed. Patient not taking: Reported on 09/12/2017 12/10/14   Danelle Berryapia, Leisa, PA-C  ibuprofen (ADVIL,MOTRIN) 800 MG tablet Take 1 tablet (800 mg total) by mouth 3 (three) times daily. Patient not taking: Reported on 09/12/2017 12/10/14   Danelle Berryapia, Leisa, PA-C  methocarbamol (ROBAXIN) 500 MG tablet Take 1 tablet (500 mg total) by mouth 2 (two) times daily as needed for muscle spasms. Patient not taking: Reported on 09/12/2017 12/10/14   Danelle Berryapia, Leisa, PA-C    Family History No family history on file.  Social History Social History   Tobacco Use  . Smoking status: Current Every  Day Smoker    Types: Cigarettes  . Smokeless tobacco: Never Used  Substance Use Topics  . Alcohol use: Yes    Comment: occasional   . Drug use: Yes    Types: Marijuana, Cocaine     Allergies   Penicillins   Review of Systems Review of Systems  Constitutional: Negative for fever.  HENT: Negative for ear pain and sore throat.   Eyes: Negative for pain and visual disturbance.  Respiratory: Negative for cough and shortness of breath.   Cardiovascular: Negative for chest pain and  palpitations.  Gastrointestinal: Negative for abdominal pain, constipation, diarrhea, nausea and vomiting.  Genitourinary: Positive for frequency. Negative for dysuria, flank pain, hematuria, pelvic pain and urgency.  Musculoskeletal: Negative for back pain.  Skin: Negative for rash.  Neurological: Positive for headaches. Negative for dizziness, weakness, light-headedness and numbness.  All other systems reviewed and are negative.  Physical Exam Updated Vital Signs BP 104/64 (BP Location: Left Arm)   Pulse 86   Temp 98.5 F (36.9 C) (Oral)   Resp 16   Ht 5\' 9"  (1.753 m)   Wt 94.3 kg (208 lb)   SpO2 100%   BMI 30.72 kg/m   Physical Exam  Constitutional: She is oriented to person, place, and time. She appears well-developed and well-nourished. She does not appear ill. No distress.  HENT:  Head: Normocephalic and atraumatic.  Mouth/Throat: Oropharynx is clear and moist.  Eyes: Pupils are equal, round, and reactive to light. Conjunctivae and EOM are normal. Right eye exhibits normal extraocular motion and no nystagmus. Left eye exhibits normal extraocular motion and no nystagmus. Right pupil is round and reactive. Left pupil is round and reactive.  Neck: Neck supple.  Cardiovascular: Normal rate, regular rhythm, normal heart sounds and intact distal pulses.  No murmur heard. Pulmonary/Chest: Effort normal and breath sounds normal. No stridor. No respiratory distress. She has no wheezes. She has no rales.  Abdominal: Soft. There is no tenderness.  Musculoskeletal: She exhibits no edema.  Neurological: She is alert and oriented to person, place, and time. GCS eye subscore is 4. GCS verbal subscore is 5. GCS motor subscore is 6.  Mental Status:  Alert, thought content appropriate, able to give a coherent history. Speech fluent without evidence of aphasia. Able to follow 2 step commands without difficulty.  Cranial Nerves:  II:  pupils equal, round, reactive to light III,IV, VI: ptosis  not present, extra-ocular motions intact bilaterally  V,VII: smile symmetric, facial light touch sensation equal VIII: hearing grossly normal to voice  X: uvula elevates symmetrically  XI: bilateral shoulder shrug symmetric and strong XII: midline tongue extension without fassiculations Motor:  Normal tone. 5/5 strength of BUE and BLE major muscle groups including strong and equal grip strength and dorsiflexion/plantar flexion Sensory: light touch normal in all extremities. Cerebellar: normal finger-to-nose with bilateral upper extremities, normal heel to shin bilat Gait: normal gait and balance. Able to walk on toes and heels with ease.  CV: 2+ radial and DP/PT pulses Negative pronator drift  Skin: Skin is warm and dry.  Psychiatric:  Anxious and tearful on exam.   Nursing note and vitals reviewed.  ED Treatments / Results  Labs (all labs ordered are listed, but only abnormal results are displayed) Labs Reviewed  COMPREHENSIVE METABOLIC PANEL - Abnormal; Notable for the following components:      Result Value   CO2 21 (*)    Calcium 8.8 (*)    All other components within normal limits  URINALYSIS, ROUTINE W REFLEX MICROSCOPIC - Abnormal; Notable for the following components:   APPearance HAZY (*)    Hgb urine dipstick SMALL (*)    Ketones, ur 5 (*)    Nitrite POSITIVE (*)    Leukocytes, UA TRACE (*)    Bacteria, UA MANY (*)    All other components within normal limits  RAPID URINE DRUG SCREEN, HOSP PERFORMED - Abnormal; Notable for the following components:   Cocaine POSITIVE (*)    All other components within normal limits  ACETAMINOPHEN LEVEL - Abnormal; Notable for the following components:   Acetaminophen (Tylenol), Serum <10 (*)    All other components within normal limits  CBC WITH DIFFERENTIAL/PLATELET  SALICYLATE LEVEL  ETHANOL  PREGNANCY, URINE    EKG None  Radiology No results found.  Procedures Procedures (including critical care time)  Medications  Ordered in ED Medications  lisinopril (PRINIVIL,ZESTRIL) tablet 5 mg (has no administration in time range)  nitrofurantoin (macrocrystal-monohydrate) (MACROBID) capsule 100 mg (has no administration in time range)  ketorolac (TORADOL) 15 MG/ML injection 15 mg (15 mg Intravenous Given 09/12/17 1644)  diphenhydrAMINE (BENADRYL) capsule 25 mg (25 mg Oral Given 09/12/17 1644)  prochlorperazine (COMPAZINE) tablet 5 mg (5 mg Oral Given 09/12/17 1851)     Initial Impression / Assessment and Plan / ED Course  I have reviewed the triage vital signs and the nursing notes.  Pertinent labs & imaging results that were available during my care of the patient were reviewed by me and considered in my medical decision making (see chart for details).   Rechecked pt. She is sleeping comfortably in bed. She states that her headache has improved after medications. She endorses feeling tired after receiving benadryl.   Recheck patient.  She continues to be sleeping comfortably in bed.  She continues to deny any headache.  She denies any other symptoms.  Final Clinical Impressions(s) / ED Diagnoses   Final diagnoses:  Nonintractable headache, unspecified chronicity pattern, unspecified headache type  Depression, unspecified depression type  Substance abuse (HCC)  Urinary tract infection with hematuria, site unspecified   47 year old female presenting with headache, depression, and substance abuse history.  Mildly hypertensive initially, all vital signs stabilized throughout her visit.  She and is endorsing some passive suicidal ideations during her examination and states that she often thinks about suicide.  She does not have a specific plan in place has been feeling very depressed recently do some some life stressors.  She is very upset on exam and is crying during my evaluation.  Denies any homicidal ideations or hallucinations.  Neurologic exam is within normal limits.  Headache is gradual in onset is not worse  headache of life.  Presented in the setting of intermittent drug use, decreased p.o. intake, and decreased sleep over the last 3 days.  Is intermittent in nature.  Not associated with vision changes, dizziness, lightheadedness.  No meningeal signs on exam.  Doubt emergent intracranial pathology at this time.  Improved after administration of Toradol and Compazine.  Also complaining of some urinary frequency with intermittent stress incontinence. UA with nitrites and leukocytes.  Will treat for urinary tract infection with macrobid for 5 days.  Initiated therapy in the emergency department and this will need to be continued at discharge if patient is cleared by TTS.   UDS positive for cocaine. CBC WNL. CMP with normal Cr and hepatic function. Acetaminophen, salicylate and ethanol levels negative.   Pt medically cleared for TTS consult.  ED Discharge Orders  None       Karrie Meres, PA-C 09/13/17 0045    Raeford Razor, MD 09/14/17 1444

## 2017-09-12 NOTE — ED Notes (Signed)
Gave Pt gingerale, sprite, crackers, and soup per request of PA.

## 2017-09-12 NOTE — ED Notes (Signed)
Bed: [facility]42 Expected date:  Expected time:  Means of arrival:  Comments: Hold for triage 4 

## 2017-09-12 NOTE — Patient Outreach (Signed)
CPSS tried to meet with the patient in order to provide substance use recovery support and help with substance use recovery resources. Patient states that she would like help with residential substance use treatment placement for her crack cocaine use. Patient has not slept in two days and was unable to stay awake during CPSS assessment. CPSS will continue to follow up with the patient and provide help with recovery resources once the patient becomes able to participate in the CPSS assessment.

## 2017-09-12 NOTE — ED Notes (Signed)
Pt given scrubs to change into 

## 2017-09-12 NOTE — Discharge Instructions (Addendum)
You were given a prescription for antibiotics to treat a urinary tract infection. Please take the antibiotic prescription fully.   Please follow up with your primary care provider within 5-7 days for re-evaluation of your symptoms. If you do not have a primary care provider, information for a healthcare clinic has been provided for you to make arrangements for follow up care. You may call the [facility] to establish primary care. Please return to the emergency department for any new or worsening symptoms.

## 2017-09-13 ENCOUNTER — Ambulatory Visit (HOSPITAL_COMMUNITY): Payer: Self-pay

## 2017-09-13 DIAGNOSIS — F419 Anxiety disorder, unspecified: Secondary | ICD-10-CM

## 2017-09-13 DIAGNOSIS — F1414 Cocaine abuse with cocaine-induced mood disorder: Secondary | ICD-10-CM

## 2017-09-13 DIAGNOSIS — F17210 Nicotine dependence, cigarettes, uncomplicated: Secondary | ICD-10-CM

## 2017-09-13 DIAGNOSIS — F329 Major depressive disorder, single episode, unspecified: Secondary | ICD-10-CM

## 2017-09-13 MED ORDER — FLUOXETINE HCL 20 MG PO CAPS
20.0000 mg | ORAL_CAPSULE | Freq: Every day | ORAL | Status: DC
  Administered 2017-09-13: 20 mg via ORAL
  Filled 2017-09-13: qty 1

## 2017-09-13 MED ORDER — NITROFURANTOIN MONOHYD MACRO 100 MG PO CAPS: 100.0000 mg | ORAL_CAPSULE | Freq: Two times a day (BID) | ORAL | 0 refills | Status: AC

## 2017-09-13 MED ORDER — FLUOXETINE HCL 20 MG PO CAPS: 20.0000 mg | ORAL_CAPSULE | Freq: Every day | ORAL | 0 refills | Status: AC

## 2017-09-13 NOTE — ED Notes (Signed)
Pt discharged safely .  All belongings were returned to patient.  Pt was in no distress.

## 2017-09-13 NOTE — ED Notes (Signed)
Pt has been accepted to a facility.  She is calling family to get her clothes brought to her.  Pt is pleasant and cooperative.

## 2017-09-13 NOTE — ED Notes (Addendum)
Pt  Continues to be calm and cooperative. D/C [facility]

## 2017-09-13 NOTE — Consult Note (Addendum)
[facility] Consult   Reason for Consult:  Cocaine abuse with depression Referring Physician:  EDP Patient Identification: Andrea Lee MRN:  [id number] Principal Diagnosis: Cocaine abuse with cocaine-induced mood disorder [facility]) Diagnosis:   Patient Active Problem List   Diagnosis Date Noted  . Cocaine abuse with cocaine-induced mood disorder [facility]) [F14.14] 09/13/2017    Priority: High    Total Time spent with patient: 45 minutes  Subjective:   Andrea Lee is a 47 y.o. female patient does not warrant admission.  HPI:  47 yo female who presented to the ED after using cocaine and having depression.  No suicidal/homicidal ideations, hallucinations, or withdrawal symptoms.  Peer consult placed yesterday but she would not awaken from her sleep.  Today, she is awake and will meet with them.  Stable for discharge.  Past Psychiatric History: Cocaine abuse  Risk to Self: None Risk to Others: None Prior Inpatient Therapy: Prior Inpatient Therapy: Yes Prior Therapy Dates: "YEARS AGO" Prior Therapy Facilty/Provider(s): [facility]; SEVERAL; FACILITIES IN [place] Reason for Treatment: MDD PER PT Prior Outpatient Therapy: Prior Outpatient Therapy: No Does patient have an ACCT team?: No Does patient have Intensive In-House Services?  : No Does patient have [facility] services? : No Does patient have P4CC services?: No  Past Medical History:  Past Medical History:  Diagnosis Date  . Hypertension     Past Surgical History:  Procedure Laterality Date  . LAPAROSCOPIC GASTRIC SLEEVE RESECTION    . TUBAL LIGATION     Family History: No family history on file. Family Psychiatric  History: none Social History:  Social History   Substance and Sexual Activity  Alcohol Use Yes   Comment: occasional      Social History   Substance and Sexual Activity  Drug Use Yes  . Types: Marijuana, Cocaine    Social History   Socioeconomic History  . Marital status: Single     Spouse name: Not on file  . Number of children: Not on file  . Years of education: Not on file  . Highest education level: Not on file  Occupational History  . Not on file  Social Needs  . Financial resource strain: Not on file  . Food insecurity:    Worry: Not on file    Inability: Not on file  . Transportation needs:    Medical: Not on file    Non-medical: Not on file  Tobacco Use  . Smoking status: Current Every Day Smoker    Types: Cigarettes  . Smokeless tobacco: Never Used  Substance and Sexual Activity  . Alcohol use: Yes    Comment: occasional   . Drug use: Yes    Types: Marijuana, Cocaine  . Sexual activity: Not on file  Lifestyle  . Physical activity:    Days per week: Not on file    Minutes per session: Not on file  . Stress: Not on file  Relationships  . Social connections:    Talks on phone: Not on file    Gets together: Not on file    Attends religious service: Not on file    Active member of club or organization: Not on file    Attends meetings of clubs or organizations: Not on file    Relationship status: Not on file  Other Topics Concern  . Not on file  Social History Narrative  . Not on file   Additional Social History: N/A    Allergies:   Allergies  Allergen Reactions  .  Penicillins Other (See Comments)    Unknown- childhood reaction     Labs:  Results for orders placed or performed during the hospital encounter of 09/12/17 (from the past 48 hour(s))  Urinalysis, Routine w reflex microscopic     Status: Abnormal   Collection Time: 09/12/17  4:34 PM  Result Value Ref Range   Color, Urine YELLOW YELLOW   APPearance HAZY (A) CLEAR   Specific Gravity, Urine 1.015 1.005 - 1.030   pH 5.0 5.0 - 8.0   Glucose, UA NEGATIVE NEGATIVE mg/dL   Hgb urine dipstick SMALL (A) NEGATIVE   Bilirubin Urine NEGATIVE NEGATIVE   Ketones, ur 5 (A) NEGATIVE mg/dL   Protein, ur NEGATIVE NEGATIVE mg/dL   Nitrite POSITIVE (A) NEGATIVE   Leukocytes, UA TRACE  (A) NEGATIVE   RBC / HPF 0-5 0 - 5 RBC/hpf   WBC, UA 0-5 0 - 5 WBC/hpf   Bacteria, UA MANY (A) NONE SEEN   Squamous Epithelial / LPF 6-10 0 - 5   Mucus PRESENT     Comment: Performed at [facility], [place] [address], [place], [place] [address]  Rapid urine drug screen (hospital performed)     Status: Abnormal   Collection Time: 09/12/17  4:34 PM  Result Value Ref Range   Opiates NONE DETECTED NONE DETECTED   Cocaine POSITIVE (A) NONE DETECTED   Benzodiazepines NONE DETECTED NONE DETECTED   Amphetamines NONE DETECTED NONE DETECTED   Tetrahydrocannabinol NONE DETECTED NONE DETECTED   Barbiturates NONE DETECTED NONE DETECTED    Comment: (NOTE) DRUG SCREEN FOR MEDICAL PURPOSES ONLY.  IF CONFIRMATION IS NEEDED FOR ANY PURPOSE, NOTIFY LAB WITHIN 5 DAYS. LOWEST DETECTABLE LIMITS FOR URINE DRUG SCREEN Drug Class                     Cutoff (ng/mL) Amphetamine and metabolites    1000 Barbiturate and metabolites    200 Benzodiazepine                 604 Tricyclics and metabolites     300 Opiates and metabolites        300 Cocaine and metabolites        300 THC                            50 Performed at [facility], [place] [address], [place], [place] [address]   CBC with Differential     Status: None   Collection Time: 09/12/17  4:45 PM  Result Value Ref Range   WBC 9.4 4.0 - 10.5 K/uL   RBC 4.64 3.87 - 5.11 MIL/uL   Hemoglobin 13.0 12.0 - 15.0 g/dL   HCT 39.8 36.0 - 46.0 %   MCV 85.8 78.0 - 100.0 fL   MCH 28.0 26.0 - 34.0 pg   MCHC 32.7 30.0 - 36.0 g/dL   RDW 14.3 11.5 - 15.5 %   Platelets 278 150 - 400 K/uL   Neutrophils Relative % 74 %   Neutro Abs 6.9 1.7 - 7.7 K/uL   Lymphocytes Relative 17 %   Lymphs Abs 1.6 0.7 - 4.0 K/uL   Monocytes Relative 9 %   Monocytes Absolute 0.8 0.1 - 1.0 K/uL   Eosinophils Relative 0 %   Eosinophils Absolute 0.0 0.0 - 0.7 K/uL   Basophils Relative 0 %   Basophils Absolute 0.0 0.0 - 0.1 K/uL    Comment:  Performed  at [facility], [place] [address] [place], [place] [address]  Comprehensive metabolic panel     Status: Abnormal   Collection Time: 09/12/17  4:45 PM  Result Value Ref Range   Sodium 138 135 - 145 mmol/L   Potassium 3.8 3.5 - 5.1 mmol/L   Chloride 107 101 - 111 mmol/L   CO2 21 (L) 22 - 32 mmol/L   Glucose, Bld 80 65 - 99 mg/dL   BUN 13 6 - 20 mg/dL   Creatinine, Ser 0.96 0.44 - 1.00 mg/dL   Calcium 8.8 (L) 8.9 - 10.3 mg/dL   Total Protein 7.5 6.5 - 8.1 g/dL   Albumin 4.2 3.5 - 5.0 g/dL   AST 20 15 - 41 U/L   ALT 19 14 - 54 U/L   Alkaline Phosphatase 68 38 - 126 U/L   Total Bilirubin 0.5 0.3 - 1.2 mg/dL   GFR calc non Af Amer >60 >60 mL/min   GFR calc Af Amer >60 >60 mL/min    Comment: (NOTE) The eGFR has been calculated using the CKD EPI equation. This calculation has not been validated in all clinical situations. eGFR's persistently <60 mL/min signify possible Chronic Kidney Disease.    Anion gap 10 5 - 15    Comment: Performed at [facility], [place] [address] [place], [place] [address]  Acetaminophen level     Status: Abnormal   Collection Time: 09/12/17  4:45 PM  Result Value Ref Range   Acetaminophen (Tylenol), Serum <10 (L) 10 - 30 ug/mL    Comment: (NOTE) Therapeutic concentrations vary significantly. A range of 10-30 ug/mL  may be an effective concentration for many patients. However, some  are best treated at concentrations outside of this range. Acetaminophen concentrations >150 ug/mL at 4 hours after ingestion  and >50 ug/mL at 12 hours after ingestion are often associated with  toxic reactions. Performed at [facility], [place] [address], [place], [place] [address]   Salicylate level     Status: None   Collection Time: 09/12/17  4:45 PM  Result Value Ref Range   Salicylate Lvl <1.4 2.8 - 30.0 mg/dL    Comment: Performed at [facility], [place] [address] [place], [place] [address]   Ethanol     Status: None   Collection Time: 09/12/17  4:45 PM  Result Value Ref Range   Alcohol, Ethyl (B) <10 <10 mg/dL    Comment: (NOTE) Lowest detectable limit for serum alcohol is 10 mg/dL. For medical purposes only. Performed at [facility], [place] [address] [place], [place] [address]   Pregnancy, urine     Status: None   Collection Time: 09/12/17 10:03 PM  Result Value Ref Range   Preg Test, Ur NEGATIVE NEGATIVE    Comment:        THE SENSITIVITY OF THIS METHODOLOGY IS >20 mIU/mL. Performed at [facility], [place] [address], [place], [place] [address]     Current Facility-Administered Medications  Medication Dose Route Frequency Provider Last Rate Last Dose  . lisinopril (PRINIVIL,ZESTRIL) tablet 5 mg  5 mg Oral Daily Patrecia Pour, NP   5 mg at 09/13/17 0910  . nitrofurantoin (macrocrystal-monohydrate) (MACROBID) capsule 100 mg  100 mg Oral BID Couture, Cortni S, PA-C   100 mg at 09/13/17 9326   Current Outpatient Medications  Medication Sig Dispense Refill  . lisinopril (PRINIVIL,ZESTRIL) 5 MG tablet TK 1 T PO QD  3  .  chlorpheniramine-HYDROcodone (TUSSIONEX PENNKINETIC ER) 10-8 MG/5ML LQCR Take 5 mLs by mouth every 12 (twelve) hours as needed for cough (Cough). (Patient not taking: Reported on 04/14/2014) 115 mL 0  . cyclobenzaprine (FLEXERIL) 10 MG tablet Take 1 tablet (10 mg total) by mouth 2 (two) times daily as needed for muscle spasms. (Patient not taking: Reported on 11/16/2014) 20 tablet 0  . fluticasone (FLONASE) 50 MCG/ACT nasal spray Place 2 sprays into both nostrils daily. (Patient not taking: Reported on 04/14/2014) 16 g 0  . HYDROcodone-acetaminophen (NORCO/VICODIN) 5-325 MG per tablet Take 2 tablets by mouth every 4 (four) hours as needed. (Patient not taking: Reported on 09/12/2017) 10 tablet 0  . ibuprofen (ADVIL,MOTRIN) 800 MG tablet Take 1 tablet (800 mg total) by mouth 3 (three) times daily. (Patient not taking: Reported on  09/12/2017) 21 tablet 0  . methocarbamol (ROBAXIN) 500 MG tablet Take 1 tablet (500 mg total) by mouth 2 (two) times daily as needed for muscle spasms. (Patient not taking: Reported on 09/12/2017) 20 tablet 0    Musculoskeletal: Strength & Muscle Tone: within normal limits Gait & Station: normal Patient leans: N/A  Psychiatric Specialty Exam: Physical Exam  Nursing note and vitals reviewed. Constitutional: She is oriented to person, place, and time. She appears well-developed and well-nourished.  HENT:  Head: Normocephalic and atraumatic.  Neck: Normal range of motion.  Respiratory: Effort normal.  Musculoskeletal: Normal range of motion.  Neurological: She is alert and oriented to person, place, and time.  Psychiatric: Her speech is normal and behavior is normal. Judgment and thought content normal. Her mood appears anxious. Cognition and memory are normal.    Review of Systems  Psychiatric/Behavioral: Positive for substance abuse. The patient is nervous/anxious.   All other systems reviewed and are negative.   Blood pressure 120/67, pulse 81, temperature 98.1 F (36.7 C), temperature source Oral, resp. rate 18, height 5' 9"  (1.753 m), weight 94.3 kg (208 lb), SpO2 99 %.Body mass index is 30.72 kg/m.  General Appearance: Casual  Eye Contact:  Good  Speech:  Normal Rate  Volume:  Normal  Mood:  Depression, mild  Affect:  Congruent  Thought Process:  Coherent and Descriptions of Associations: Intact  Orientation:  Full (Time, Place, and Person)  Thought Content:  WDL and Logical  Suicidal Thoughts:  No  Homicidal Thoughts:  No  Memory:  Immediate;   Good Recent;   Good Remote;   Good  Judgement:  Fair  Insight:  Fair  Psychomotor Activity:  Normal  Concentration:  Concentration: Good and Attention Span: Good  Recall:  Good  Fund of Knowledge:  Fair  Language:  Good  Akathisia:  No  Handed:  Right  AIMS (if indicated):   N/A  Assets:  Leisure Time Physical  Health Resilience Social Support  ADL's:  Intact  Cognition:  WNL  Sleep:   N/A     Treatment Plan Summary: Cocaine abuse with cocaine induced mood disorder: -Started Prozac 20 mg daily for depression  Disposition: No evidence of imminent risk to self or others at present.    Waylan Boga, NP 09/13/2017 10:12 AM   Patient seen face-to-face for psychiatric evaluation, chart reviewed and case discussed with the physician extender and developed treatment plan. Reviewed the information documented and agree with the treatment plan.  Buford Dresser, DO 09/13/17 11:55 AM

## 2017-09-13 NOTE — [facility] Suicide Risk Assessment (Signed)
Suicide Risk Assessment  Discharge Assessment   [facility]   Principal Problem: Cocaine abuse with cocaine-induced mood disorder [facility](HCC) Discharge Diagnoses:  Patient Active Problem List   Diagnosis Date Noted  . Cocaine abuse with cocaine-induced mood disorder [facility](HCC) [F14.14] 09/13/2017    Priority: High    Total Time spent with patient: 45 minutes  Musculoskeletal: Strength & Muscle Tone: within normal limits Gait & Station: normal Patient leans: N/A  Psychiatric Specialty Exam: Physical Exam  Nursing note and vitals reviewed. Constitutional: She is oriented to person, place, and time. She appears well-developed and well-nourished.  HENT:  Head: Normocephalic.  Neck: Normal range of motion.  Respiratory: Effort normal.  Musculoskeletal: Normal range of motion.  Neurological: She is alert and oriented to person, place, and time.  Psychiatric: Her speech is normal and behavior is normal. Judgment and thought content normal. Her mood appears anxious. Cognition and memory are normal.    Review of Systems  Psychiatric/Behavioral: Positive for substance abuse. The patient is nervous/anxious.   All other systems reviewed and are negative.   Blood pressure 120/67, pulse 81, temperature 98.1 F (36.7 C), temperature source Oral, resp. rate 18, height 5\' 9"  (1.753 m), weight 94.3 kg (208 lb), SpO2 99 %.Body mass index is 30.72 kg/m.  General Appearance: Casual  Eye Contact:  Good  Speech:  Normal Rate  Volume:  Normal  Mood: depression, mild  Affect:  Congruent  Thought Process:  Coherent and Descriptions of Associations: Intact  Orientation:  Full (Time, Place, and Person)  Thought Content:  WDL and Logical  Suicidal Thoughts:  No  Homicidal Thoughts:  No  Memory:  Immediate;   Good Recent;   Good Remote;   Good  Judgement:  Fair  Insight:  Fair  Psychomotor Activity:  Normal  Concentration:  Concentration: Good and Attention Span: Good   Recall:  Good  Fund of Knowledge:  Fair  Language:  Good  Akathisia:  No  Handed:  Right  AIMS (if indicated):     Assets:  Leisure Time Physical Health Resilience Social Support  ADL's:  Intact  Cognition:  WNL  Sleep:       Mental Status Per Nursing Assessment::   On Admission:   cocaine abuse with depression  Demographic Factors:  NA  Loss Factors: NA  Historical Factors: NA  Risk Reduction Factors:   Responsible for children under 46 years of age, Sense of responsibility to family, Living with another person, especially a relative and Positive social support  Continued Clinical Symptoms:  Depression , mild  Cognitive Features That Contribute To Risk:  None    Suicide Risk:  Minimal: No identifiable suicidal ideation.  Patients presenting with no risk factors but with morbid ruminations; may be classified as minimal risk based on the severity of the depressive symptoms  Follow-up Information    [facility].   Why:  Please follow up with primary care. [facility] will see patients without insurance. Contact information: [address] [place] [phone]       [facility]-EMERGENCY DEPT.   Specialty:  Emergency Medicine Why:  Return to the ER with any new or worsening symptoms Contact information: [address] [id number] [place] [phone]          Plan Of Care/Follow-up recommendations:  Activity:  as tolerated Diet:  heart healthy diet  Amali Uhls, NP 09/13/2017, 10:19 AM

## 2017-09-13 NOTE — Patient Outreach (Signed)
ED Peer Support Specialist Patient Intake (Complete at intake & 30-60 Day Follow-up)  Name: Andrea Lee  MRN: [id number]  Age: 47 y.o.   Date of Admission: 09/13/2017  Intake: Initial Comments:      Primary Reason Admitted:  Substance Abuse   Lab values: Alcohol/ETOH: Negative Positive UDS? Yes Amphetamines: No Barbiturates: No Benzodiazepines: No Cocaine: Yes Opiates: No Cannabinoids: No  Demographic information: Gender: Female Ethnicity: African American Marital Status: Separated Insurance Status: Uninsured/Self-pay Ecologist (Work Neurosurgeon, Physicist, medical, etc.: No Lives with:   Living situation:    Reported Patient History: Patient reported health conditions: Anxiety disorders, Depression(Just from getting high) Patient aware of HIV and hepatitis status: No  In past year, has patient visited ED for any reason? Yes  Number of ED visits:    Reason(s) for visit:    In past year, has patient been hospitalized for any reason? Yes  Number of hospitalizations: 4  Reason(s) for hospitalization: Various reasons  In past year, has patient been arrested? No  Number of arrests:    Reason(s) for arrest:    In past year, has patient been incarcerated? No  Number of incarcerations:    Reason(s) for incarceration:    In past year, has patient received medication-assisted treatment? No  In past year, patient received the following treatments:    In past year, has patient received any harm reduction services? No  Did this include any of the following?    In past year, has patient received care from a mental health provider for diagnosis other than SUD? No  In past year, is this first time patient has overdosed? No  Number of past overdoses:    In past year, is this first time patient has been hospitalized for an overdose? No  Number of hospitalizations for overdose(s):    Is patient currently receiving treatment for a mental  health diagnosis? No  Patient reports experiencing difficulty participating in SUD treatment: No    Most important reason(s) for this difficulty?    Has patient received prior services for treatment? No  In past, patient has received services from following agencies:    Plan of Care:  Suggested follow up at these agencies/treatment centers: [facility])([facility] at [facility])  Other information: [facility]PSS met with Pt an was able to gain an understanding of what services Pt wanted at the time. CPSS was made aware that Pt has been using and feels that she needs a facility to get off of Crack cocaine. CPSS was able to complete the series of questions. CPSS talked with Pt to give her a little more hope then what she came to the ER with. CPSS talked with Pt about [facility], as well as a women's substance abuse facility. [facility] faxed Pt out and are waiting to hear back from facility now.   Aaron Edelman [place], CPSS  09/13/2017 11:35 AM

## 2017-09-13 NOTE — ED Notes (Signed)
Pts. Daughter brought two bags and a duffel bag of belongings for patient to take.  Pt ready for discharge and waiting on ride.

## 2017-09-13 NOTE — Patient Outreach (Signed)
CPSS walked Pt to her transportation to attend treatment services. CPSS left contact information for Pt an staff.

## 2019-02-06 ENCOUNTER — Emergency Department (HOSPITAL_COMMUNITY): Payer: Self-pay

## 2019-02-06 ENCOUNTER — Emergency Department (HOSPITAL_COMMUNITY)
Admission: EM | Admit: 2019-02-06 | Discharge: 2019-02-06 | Discharge status: Against Medical Advice | Payer: Self-pay | Attending: Emergency Medicine | Admitting: Emergency Medicine

## 2019-02-06 ENCOUNTER — Other Ambulatory Visit: Payer: Self-pay

## 2019-02-06 ENCOUNTER — Encounter (HOSPITAL_COMMUNITY): Payer: Self-pay | Admitting: Emergency Medicine

## 2019-02-06 DIAGNOSIS — Z5321 Procedure and treatment not carried out due to patient leaving prior to being seen by health care provider: Secondary | ICD-10-CM | POA: Insufficient documentation

## 2019-02-06 DIAGNOSIS — Y905 Blood alcohol level of 100-119 mg/100 ml: Secondary | ICD-10-CM | POA: Insufficient documentation

## 2019-02-06 DIAGNOSIS — F10920 Alcohol use, unspecified with intoxication, uncomplicated: Secondary | ICD-10-CM | POA: Insufficient documentation

## 2019-02-06 LAB — I-STAT BETA HCG BLOOD, ED (MC, WL, AP ONLY): I-stat hCG, quantitative: <5 m[IU]/mL (ref <5)

## 2019-02-06 LAB — COMPREHENSIVE METABOLIC PANEL
ALT: 41 U/L (ref 0–44)
AST: 39 U/L (ref 15–41)
Albumin: 4 g/dL (ref 3.5–5.0)
Alkaline Phosphatase: 59 U/L (ref 38–126)
Anion gap: 11 (ref 5–15)
BUN: 10 mg/dL (ref 6–20)
CO2: 19 mmol/L — ABNORMAL LOW (ref 22–32)
Calcium: 9 mg/dL (ref 8.9–10.3)
Chloride: 109 mmol/L (ref 98–111)
Creatinine, Ser: 1.18 mg/dL — ABNORMAL HIGH (ref 0.44–1.00)
GFR calc Af Amer: >60 mL/min (ref >60)
GFR calc non Af Amer: 55 mL/min — ABNORMAL LOW (ref >60)
Glucose, Bld: 79 mg/dL (ref 70–99)
Potassium: 3.8 mmol/L (ref 3.5–5.1)
Sodium: 139 mmol/L (ref 135–145)
Total Bilirubin: 0.5 mg/dL (ref 0.3–1.2)
Total Protein: 6.8 g/dL (ref 6.5–8.1)

## 2019-02-06 LAB — ETHANOL: Alcohol, Ethyl (B): 107 mg/dL — ABNORMAL HIGH (ref <10)
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
## 2019-02-06 NOTE — ED Notes (Signed)
Patient left ER, unable to locate patient by staff.

## 2019-02-10 ENCOUNTER — Other Ambulatory Visit: Payer: Self-pay

## 2019-02-10 ENCOUNTER — Encounter (HOSPITAL_COMMUNITY): Payer: Self-pay | Admitting: Emergency Medicine

## 2019-02-10 ENCOUNTER — Emergency Department (HOSPITAL_COMMUNITY)
Admission: EM | Admit: 2019-02-10 | Discharge: 2019-02-10 | Disposition: A | Discharge status: Home / Self Care | Payer: Self-pay | Attending: Emergency Medicine | Admitting: Emergency Medicine

## 2019-02-10 ENCOUNTER — Emergency Department (HOSPITAL_COMMUNITY): Payer: Self-pay

## 2019-02-10 DIAGNOSIS — I10 Essential (primary) hypertension: Secondary | ICD-10-CM | POA: Insufficient documentation

## 2019-02-10 DIAGNOSIS — Y929 Unspecified place or not applicable: Secondary | ICD-10-CM | POA: Insufficient documentation

## 2019-02-10 DIAGNOSIS — R2232 Localized swelling, mass and lump, left upper limb: Secondary | ICD-10-CM | POA: Insufficient documentation

## 2019-02-10 DIAGNOSIS — Y9389 Activity, other specified: Secondary | ICD-10-CM | POA: Insufficient documentation

## 2019-02-10 DIAGNOSIS — R220 Localized swelling, mass and lump, head: Secondary | ICD-10-CM | POA: Insufficient documentation

## 2019-02-10 DIAGNOSIS — Z79899 Other long term (current) drug therapy: Secondary | ICD-10-CM | POA: Insufficient documentation

## 2019-02-10 DIAGNOSIS — M79642 Pain in left hand: Secondary | ICD-10-CM | POA: Insufficient documentation

## 2019-02-10 DIAGNOSIS — F17210 Nicotine dependence, cigarettes, uncomplicated: Secondary | ICD-10-CM | POA: Insufficient documentation

## 2019-02-10 DIAGNOSIS — Y999 Unspecified external cause status: Secondary | ICD-10-CM | POA: Insufficient documentation

## 2019-02-10 MED ORDER — HYDROCODONE-ACETAMINOPHEN 5-325 MG PO TABS
1.0000 | ORAL_TABLET | Freq: Once | ORAL | Status: AC
  Administered 2019-02-10: 22:00:00 1 via ORAL
  Filled 2019-02-10: qty 1
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
## 2019-02-10 NOTE — ED Notes (Signed)
Patient verbalizes understanding of discharge instructions. Opportunity for questioning and answers were provided. Armband removed by staff, pt discharged from ED.  

## 2019-02-10 NOTE — Discharge Instructions (Signed)
Tylenol and ibuprofen as needed for pain.  Do not take more than 4000 mg Tylenol or more than 2400 mg ibuprofen.  You may also use heat and ice to the area.  If you notice this area starts becoming red, warm, redness starts going up your arm you need to seek reevaluation.
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
## 2019-06-02 ENCOUNTER — Emergency Department (HOSPITAL_COMMUNITY)
Admission: EM | Admit: 2019-06-02 | Discharge: 2019-06-03 | Payer: No Typology Code available for payment source | Attending: Emergency Medicine | Admitting: Emergency Medicine

## 2019-06-02 ENCOUNTER — Other Ambulatory Visit: Payer: Self-pay

## 2019-06-02 DIAGNOSIS — F17210 Nicotine dependence, cigarettes, uncomplicated: Secondary | ICD-10-CM | POA: Insufficient documentation

## 2019-06-02 DIAGNOSIS — Z041 Encounter for examination and observation following transport accident: Secondary | ICD-10-CM | POA: Insufficient documentation

## 2019-06-02 DIAGNOSIS — I10 Essential (primary) hypertension: Secondary | ICD-10-CM | POA: Insufficient documentation
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
## 2019-06-03 MED ORDER — IBUPROFEN 800 MG PO TABS: 800.0000 mg | ORAL_TABLET | Freq: Three times a day (TID) | ORAL | 0 refills | Status: AC

## 2019-06-03 MED ORDER — IBUPROFEN 800 MG PO TABS
800.0000 mg | ORAL_TABLET | Freq: Once | ORAL | Status: AC
  Administered 2019-06-03: 800 mg via ORAL
  Filled 2019-06-03: qty 1
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
## 2019-06-03 NOTE — ED Notes (Signed)
Medications given and charted per [facility]. Tolerated well. No distress noted. Pt brought out in a wheelchair in custody by [employer]. Pt read and understood discharge paperwork. Pt conscious, breathing, and A&Ox4.
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
## 2019-06-07 ENCOUNTER — Emergency Department (HOSPITAL_COMMUNITY)
Admission: EM | Admit: 2019-06-07 | Discharge: 2019-06-07 | Disposition: A | Discharge status: Home / Self Care | Payer: Self-pay | Attending: Emergency Medicine | Admitting: Emergency Medicine

## 2019-06-07 ENCOUNTER — Other Ambulatory Visit: Payer: Self-pay

## 2019-06-07 DIAGNOSIS — Z5321 Procedure and treatment not carried out due to patient leaving prior to being seen by health care provider: Secondary | ICD-10-CM | POA: Insufficient documentation

## 2019-06-07 DIAGNOSIS — R519 Headache, unspecified: Secondary | ICD-10-CM | POA: Insufficient documentation

## 2019-06-07 NOTE — ED Notes (Signed)
Pt seen seen walking out of the unit towards the emergency room exit.
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
## 2019-06-07 NOTE — ED Provider Notes (Cosign Needed)
Patient left without being seen.   Jeannie Fend, PA-C 06/07/19 [phone]

## 2020-02-11 ENCOUNTER — Encounter (HOSPITAL_COMMUNITY): Payer: Self-pay

## 2020-02-11 ENCOUNTER — Emergency Department (HOSPITAL_COMMUNITY)
Admission: EM | Admit: 2020-02-11 | Discharge: 2020-02-11 | Disposition: A | Discharge status: Home / Self Care | Payer: Self-pay | Attending: Emergency Medicine | Admitting: Emergency Medicine

## 2020-02-11 ENCOUNTER — Emergency Department (HOSPITAL_COMMUNITY): Payer: Self-pay

## 2020-02-11 DIAGNOSIS — S0511XA Contusion of eyeball and orbital tissues, right eye, initial encounter: Secondary | ICD-10-CM | POA: Insufficient documentation

## 2020-02-11 DIAGNOSIS — S0512XA Contusion of eyeball and orbital tissues, left eye, initial encounter: Secondary | ICD-10-CM | POA: Insufficient documentation

## 2020-02-11 DIAGNOSIS — Z5321 Procedure and treatment not carried out due to patient leaving prior to being seen by health care provider: Secondary | ICD-10-CM | POA: Insufficient documentation
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
## 2020-03-15 ENCOUNTER — Emergency Department (HOSPITAL_COMMUNITY): Payer: Self-pay

## 2020-03-15 ENCOUNTER — Emergency Department (HOSPITAL_COMMUNITY)
Admission: EM | Admit: 2020-03-15 | Discharge: 2020-03-15 | Disposition: A | Discharge status: Home / Self Care | Payer: Self-pay | Attending: Emergency Medicine | Admitting: Emergency Medicine

## 2020-03-15 ENCOUNTER — Encounter (HOSPITAL_COMMUNITY): Payer: Self-pay

## 2020-03-15 DIAGNOSIS — F17220 Nicotine dependence, chewing tobacco, uncomplicated: Secondary | ICD-10-CM | POA: Insufficient documentation

## 2020-03-15 DIAGNOSIS — T1490XA Injury, unspecified, initial encounter: Secondary | ICD-10-CM

## 2020-03-15 DIAGNOSIS — S21112A Laceration without foreign body of left front wall of thorax without penetration into thoracic cavity, initial encounter: Secondary | ICD-10-CM | POA: Insufficient documentation

## 2020-03-15 DIAGNOSIS — Z23 Encounter for immunization: Secondary | ICD-10-CM | POA: Insufficient documentation

## 2020-03-15 DIAGNOSIS — T148XXA Other injury of unspecified body region, initial encounter: Secondary | ICD-10-CM

## 2020-03-15 DIAGNOSIS — R0789 Other chest pain: Secondary | ICD-10-CM | POA: Insufficient documentation

## 2020-03-15 DIAGNOSIS — R079 Chest pain, unspecified: Secondary | ICD-10-CM

## 2020-03-15 DIAGNOSIS — Z20822 Contact with and (suspected) exposure to covid-19: Secondary | ICD-10-CM | POA: Insufficient documentation

## 2020-03-15 LAB — COMPREHENSIVE METABOLIC PANEL
ALT: 32 U/L (ref 0–44)
AST: 31 U/L (ref 15–41)
Albumin: 4.2 g/dL (ref 3.5–5.0)
Alkaline Phosphatase: 54 U/L (ref 38–126)
Anion gap: 16 — ABNORMAL HIGH (ref 5–15)
BUN: 6 mg/dL (ref 6–20)
CO2: 18 mmol/L — ABNORMAL LOW (ref 22–32)
Calcium: 9.2 mg/dL (ref 8.9–10.3)
Chloride: 105 mmol/L (ref 98–111)
Creatinine, Ser: 0.9 mg/dL (ref 0.44–1.00)
GFR, Estimated: >60 mL/min (ref >60)
Glucose, Bld: 113 mg/dL — ABNORMAL HIGH (ref 70–99)
Potassium: 3.3 mmol/L — ABNORMAL LOW (ref 3.5–5.1)
Sodium: 139 mmol/L (ref 135–145)
Total Bilirubin: 0.4 mg/dL (ref 0.3–1.2)
Total Protein: 7.1 g/dL (ref 6.5–8.1)

## 2020-03-15 LAB — CBC
HCT: 39 % (ref 36.0–46.0)
Hemoglobin: 13 g/dL (ref 12.0–15.0)
MCH: 31 pg (ref 26.0–34.0)
MCHC: 33.3 g/dL (ref 30.0–36.0)
MCV: 92.9 fL (ref 80.0–100.0)
Platelets: 309 10*3/uL (ref 150–400)
RBC: 4.2 MIL/uL (ref 3.87–5.11)
RDW: 14.9 % (ref 11.5–15.5)
WBC: 7.5 10*3/uL (ref 4.0–10.5)
nRBC: 0 % (ref 0.0–0.2)

## 2020-03-15 LAB — TYPE AND SCREEN
ABO/RH(D): A POS
Antibody Screen: NEGATIVE
Unit division: 0
Unit division: 0
Unit division: 0
Unit division: 0

## 2020-03-15 LAB — I-STAT CHEM 8, ED
BUN: 7 mg/dL (ref 6–20)
Calcium, Ion: 1.01 mmol/L — ABNORMAL LOW (ref 1.15–1.40)
Chloride: 107 mmol/L (ref 98–111)
Creatinine, Ser: 1.2 mg/dL — ABNORMAL HIGH (ref 0.44–1.00)
Glucose, Bld: 109 mg/dL — ABNORMAL HIGH (ref 70–99)
HCT: 42 % (ref 36.0–46.0)
Hemoglobin: 14.3 g/dL (ref 12.0–15.0)
Potassium: 4.8 mmol/L (ref 3.5–5.1)
Sodium: 138 mmol/L (ref 135–145)
TCO2: 23 mmol/L (ref 22–32)

## 2020-03-15 LAB — RESP PANEL BY RT-PCR (FLU A&B, COVID) ARPGX2
Influenza A by PCR: NEGATIVE
Influenza B by PCR: NEGATIVE
SARS Coronavirus 2 by RT PCR: NEGATIVE

## 2020-03-15 LAB — BPAM RBC
Blood Product Expiration Date: 202112122359
Blood Product Expiration Date: 202112122359
Blood Product Expiration Date: 202201072359
Blood Product Expiration Date: 202201072359
ISSUE DATE / TIME: 202112041000
ISSUE DATE / TIME: 202112041000
ISSUE DATE / TIME: 202112041002
ISSUE DATE / TIME: 202112041002
Unit Type and Rh: 5100
Unit Type and Rh: 5100
Unit Type and Rh: 9500
Unit Type and Rh: 9500

## 2020-03-15 LAB — ETHANOL: Alcohol, Ethyl (B): 255 mg/dL — ABNORMAL HIGH (ref <10)

## 2020-03-15 LAB — LACTIC ACID, PLASMA: Lactic Acid, Venous: 2.9 mmol/L (ref 0.5–1.9)

## 2020-03-15 MED ORDER — CEFAZOLIN SODIUM-DEXTROSE 2-4 GM/100ML-% IV SOLN
2.0000 g | Freq: Once | INTRAVENOUS | Status: AC
  Administered 2020-03-15: 2 g via INTRAVENOUS

## 2020-03-15 MED ORDER — TETANUS-DIPHTH-ACELL PERTUSSIS 5-2.5-18.5 LF-MCG/0.5 IM SUSY
0.5000 mL | PREFILLED_SYRINGE | Freq: Once | INTRAMUSCULAR | Status: AC
  Administered 2020-03-15: 0.5 mL via INTRAMUSCULAR

## 2020-03-15 MED ORDER — IOHEXOL 300 MG/ML  SOLN
100.0000 mL | Freq: Once | INTRAMUSCULAR | Status: AC | PRN
  Administered 2020-03-15: 100 mL via INTRAVENOUS

## 2020-03-15 MED ORDER — FENTANYL CITRATE (PF) 100 MCG/2ML IJ SOLN
INTRAMUSCULAR | Status: AC
  Filled 2020-03-15: qty 2

## 2020-03-15 MED ORDER — TETANUS-DIPHTHERIA TOXOIDS TD 5-2 LFU IM INJ: 0.5000 mL | INJECTION | Freq: Once | INTRAMUSCULAR | Status: DC
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
## 2020-03-15 NOTE — Discharge Instructions (Addendum)
Have staples removed in 7 to 10 days. °
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
## 2020-05-02 ENCOUNTER — Encounter (HOSPITAL_COMMUNITY): Payer: Self-pay | Admitting: Emergency Medicine

## 2020-05-02 ENCOUNTER — Emergency Department (HOSPITAL_COMMUNITY): Payer: Self-pay

## 2020-05-02 ENCOUNTER — Emergency Department (HOSPITAL_COMMUNITY)
Admission: EM | Admit: 2020-05-02 | Discharge: 2020-05-02 | Discharge status: Against Medical Advice | Payer: Self-pay | Attending: Emergency Medicine | Admitting: Emergency Medicine

## 2020-05-02 DIAGNOSIS — Z1152 Encounter for screening for COVID-19: Secondary | ICD-10-CM | POA: Insufficient documentation

## 2020-05-02 DIAGNOSIS — I10 Essential (primary) hypertension: Secondary | ICD-10-CM | POA: Insufficient documentation

## 2020-05-02 DIAGNOSIS — Z79899 Other long term (current) drug therapy: Secondary | ICD-10-CM | POA: Diagnosis not present

## 2020-05-02 DIAGNOSIS — F17210 Nicotine dependence, cigarettes, uncomplicated: Secondary | ICD-10-CM | POA: Diagnosis not present

## 2020-05-02 DIAGNOSIS — R079 Chest pain, unspecified: Secondary | ICD-10-CM

## 2020-05-02 DIAGNOSIS — Z20822 Contact with and (suspected) exposure to covid-19: Secondary | ICD-10-CM

## 2020-05-02 LAB — BASIC METABOLIC PANEL
Anion gap: 15 (ref 5–15)
BUN: 7 mg/dL (ref 6–20)
CO2: 21 mmol/L — ABNORMAL LOW (ref 22–32)
Calcium: 9.1 mg/dL (ref 8.9–10.3)
Chloride: 104 mmol/L (ref 98–111)
Creatinine, Ser: 0.72 mg/dL (ref 0.44–1.00)
GFR, Estimated: >60 mL/min (ref >60)
Glucose, Bld: 69 mg/dL — ABNORMAL LOW (ref 70–99)
Potassium: 4 mmol/L (ref 3.5–5.1)
Sodium: 140 mmol/L (ref 135–145)

## 2020-05-02 LAB — CBC
HCT: 41.4 % (ref 36.0–46.0)
Hemoglobin: 13 g/dL (ref 12.0–15.0)
MCH: 29.4 pg (ref 26.0–34.0)
MCHC: 31.4 g/dL (ref 30.0–36.0)
MCV: 93.7 fL (ref 80.0–100.0)
Platelets: 303 10*3/uL (ref 150–400)
RBC: 4.42 MIL/uL (ref 3.87–5.11)
RDW: 13.3 % (ref 11.5–15.5)
WBC: 7.3 10*3/uL (ref 4.0–10.5)
nRBC: 0 % (ref 0.0–0.2)

## 2020-05-02 LAB — I-STAT BETA HCG BLOOD, ED (MC, WL, AP ONLY): I-stat hCG, quantitative: 8.1 m[IU]/mL — ABNORMAL HIGH (ref <5)

## 2020-05-02 LAB — TROPONIN I (HIGH SENSITIVITY): Troponin I (High Sensitivity): 5 ng/L (ref <18)
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
## 2020-05-02 NOTE — ED Notes (Signed)
Patient transported to X-ray 

## 2020-05-02 NOTE — ED Triage Notes (Signed)
Patient BIB [facility] for chest pain, patient tested positive for COVID 8 days ago. Reports ETOH use last night, denies today. States she was at [facility] today when she had a "tongue seizure" and vomited, then the chest pain started.

## 2020-05-27 ENCOUNTER — Emergency Department (HOSPITAL_COMMUNITY)
Admission: EM | Admit: 2020-05-27 | Discharge: 2020-05-27 | Disposition: A | Discharge status: Home / Self Care | Payer: Self-pay | Attending: Emergency Medicine | Admitting: Emergency Medicine

## 2020-05-27 ENCOUNTER — Encounter (HOSPITAL_COMMUNITY): Payer: Self-pay | Admitting: Emergency Medicine

## 2020-05-27 ENCOUNTER — Other Ambulatory Visit: Payer: Self-pay

## 2020-05-27 DIAGNOSIS — Z5900 Homelessness unspecified: Secondary | ICD-10-CM | POA: Insufficient documentation

## 2020-05-27 DIAGNOSIS — Z5321 Procedure and treatment not carried out due to patient leaving prior to being seen by health care provider: Secondary | ICD-10-CM | POA: Insufficient documentation

## 2020-05-27 DIAGNOSIS — J00 Acute nasopharyngitis [common cold]: Secondary | ICD-10-CM | POA: Insufficient documentation

## 2020-05-27 NOTE — ED Notes (Signed)
Pt called to recheck vitals with no response.
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
## 2020-08-08 ENCOUNTER — Emergency Department (HOSPITAL_COMMUNITY): Payer: Self-pay

## 2020-08-08 ENCOUNTER — Emergency Department (HOSPITAL_COMMUNITY)
Admission: EM | Admit: 2020-08-08 | Discharge: 2020-08-08 | Disposition: A | Discharge status: Home / Self Care | Payer: Self-pay | Attending: Emergency Medicine | Admitting: Emergency Medicine

## 2020-08-08 ENCOUNTER — Other Ambulatory Visit: Payer: Self-pay

## 2020-08-08 ENCOUNTER — Encounter (HOSPITAL_COMMUNITY): Payer: Self-pay

## 2020-08-08 DIAGNOSIS — T148XXA Other injury of unspecified body region, initial encounter: Secondary | ICD-10-CM

## 2020-08-08 DIAGNOSIS — S21031A Puncture wound without foreign body of right breast, initial encounter: Secondary | ICD-10-CM | POA: Insufficient documentation

## 2020-08-08 DIAGNOSIS — Z20822 Contact with and (suspected) exposure to covid-19: Secondary | ICD-10-CM | POA: Insufficient documentation

## 2020-08-08 DIAGNOSIS — X781XXA Intentional self-harm by knife, initial encounter: Secondary | ICD-10-CM | POA: Insufficient documentation

## 2020-08-08 DIAGNOSIS — R456 Violent behavior: Secondary | ICD-10-CM | POA: Insufficient documentation

## 2020-08-08 DIAGNOSIS — F17210 Nicotine dependence, cigarettes, uncomplicated: Secondary | ICD-10-CM | POA: Insufficient documentation

## 2020-08-08 DIAGNOSIS — F1490 Cocaine use, unspecified, uncomplicated: Secondary | ICD-10-CM | POA: Insufficient documentation

## 2020-08-08 LAB — SAMPLE TO BLOOD BANK

## 2020-08-08 LAB — CBC
HCT: 42.4 % (ref 36.0–46.0)
Hemoglobin: 13.7 g/dL (ref 12.0–15.0)
MCH: 29.1 pg (ref 26.0–34.0)
MCHC: 32.3 g/dL (ref 30.0–36.0)
MCV: 90.2 fL (ref 80.0–100.0)
Platelets: 239 10*3/uL (ref 150–400)
RBC: 4.7 MIL/uL (ref 3.87–5.11)
RDW: 14.7 % (ref 11.5–15.5)
WBC: 5.9 10*3/uL (ref 4.0–10.5)
nRBC: 0 % (ref 0.0–0.2)

## 2020-08-08 LAB — I-STAT CHEM 8, ED
BUN: 15 mg/dL (ref 6–20)
Calcium, Ion: 1.07 mmol/L — ABNORMAL LOW (ref 1.15–1.40)
Chloride: 107 mmol/L (ref 98–111)
Creatinine, Ser: 0.9 mg/dL (ref 0.44–1.00)
Glucose, Bld: 96 mg/dL (ref 70–99)
HCT: 43 % (ref 36.0–46.0)
Hemoglobin: 14.6 g/dL (ref 12.0–15.0)
Potassium: 4.3 mmol/L (ref 3.5–5.1)
Sodium: 140 mmol/L (ref 135–145)
TCO2: 22 mmol/L (ref 22–32)

## 2020-08-08 LAB — RAPID URINE DRUG SCREEN, HOSP PERFORMED
Amphetamines: NOT DETECTED
Barbiturates: NOT DETECTED
Benzodiazepines: NOT DETECTED
Cocaine: POSITIVE — AB
Opiates: NOT DETECTED
Tetrahydrocannabinol: NOT DETECTED

## 2020-08-08 LAB — COMPREHENSIVE METABOLIC PANEL
ALT: 27 U/L (ref 0–44)
AST: 32 U/L (ref 15–41)
Albumin: 4.1 g/dL (ref 3.5–5.0)
Alkaline Phosphatase: 66 U/L (ref 38–126)
Anion gap: 14 (ref 5–15)
BUN: 9 mg/dL (ref 6–20)
CO2: 20 mmol/L — ABNORMAL LOW (ref 22–32)
Calcium: 9.3 mg/dL (ref 8.9–10.3)
Chloride: 104 mmol/L (ref 98–111)
Creatinine, Ser: 0.84 mg/dL (ref 0.44–1.00)
GFR, Estimated: >60 mL/min (ref >60)
Glucose, Bld: 96 mg/dL (ref 70–99)
Potassium: 3.9 mmol/L (ref 3.5–5.1)
Sodium: 138 mmol/L (ref 135–145)
Total Bilirubin: 0.6 mg/dL (ref 0.3–1.2)
Total Protein: 7.2 g/dL (ref 6.5–8.1)

## 2020-08-08 LAB — URINALYSIS, ROUTINE W REFLEX MICROSCOPIC
Bacteria, UA: NONE SEEN
Bilirubin Urine: NEGATIVE
Glucose, UA: NEGATIVE mg/dL
Ketones, ur: NEGATIVE mg/dL
Leukocytes,Ua: NEGATIVE
Nitrite: NEGATIVE
Protein, ur: NEGATIVE mg/dL
Specific Gravity, Urine: 1.011 (ref 1.005–1.030)
pH: 5 (ref 5.0–8.0)

## 2020-08-08 LAB — RESP PANEL BY RT-PCR (FLU A&B, COVID) ARPGX2
Influenza A by PCR: NEGATIVE
Influenza B by PCR: NEGATIVE
SARS Coronavirus 2 by RT PCR: NEGATIVE

## 2020-08-08 LAB — TROPONIN I (HIGH SENSITIVITY): Troponin I (High Sensitivity): 3 ng/L (ref <18)

## 2020-08-08 LAB — ETHANOL: Alcohol, Ethyl (B): 152 mg/dL — ABNORMAL HIGH (ref <10)

## 2020-08-08 MED ORDER — LIDOCAINE HCL (PF) 1 % IJ SOLN
30.0000 mL | Freq: Once | INTRAMUSCULAR | Status: DC
  Filled 2020-08-08: qty 30

## 2020-08-08 MED ORDER — IOHEXOL 300 MG/ML  SOLN
80.0000 mL | Freq: Once | INTRAMUSCULAR | Status: AC | PRN
  Administered 2020-08-08: 80 mL via INTRAVENOUS

## 2020-08-08 MED ORDER — FENTANYL CITRATE (PF) 100 MCG/2ML IJ SOLN: 50.0000 ug | Freq: Once | INTRAMUSCULAR | Status: DC
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
## 2020-08-08 NOTE — Discharge Instructions (Addendum)
You were evaluated in the Emergency Department and after careful evaluation, we did not find any emergent condition requiring admission or further testing in the hospital.  Your exam/testing today was overall reassuring.  Please return to the Emergency Department if you experience any worsening of your condition.  Thank you for allowing us to be a part of your care.  
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
## 2020-08-11 ENCOUNTER — Encounter (HOSPITAL_COMMUNITY): Payer: Self-pay | Admitting: Emergency Medicine

## 2020-10-27 LAB — CDS SEROLOGY
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
